# Patient Record
Sex: Female | Born: 1970 | Race: Black or African American | Hispanic: No | Marital: Single | Smoking: Never smoker
Health system: Southern US, Community
[De-identification: ages and names within clinical notes are randomized; demographics above are authoritative.]

## PROBLEM LIST (undated history)

## (undated) DIAGNOSIS — I10 Essential (primary) hypertension: Secondary | ICD-10-CM

## (undated) DIAGNOSIS — R011 Cardiac murmur, unspecified: Secondary | ICD-10-CM

## (undated) DIAGNOSIS — K219 Gastro-esophageal reflux disease without esophagitis: Secondary | ICD-10-CM

## (undated) HISTORY — DX: Cardiac murmur, unspecified: R01.1

## (undated) HISTORY — DX: Gastro-esophageal reflux disease without esophagitis: K21.9

## (undated) HISTORY — DX: Essential (primary) hypertension: I10

---

## 1999-12-30 ENCOUNTER — Other Ambulatory Visit: Admission: RE | Admit: 1999-12-30 | Discharge: 1999-12-30 | Payer: Self-pay | Admitting: Orthopedic Surgery

## 2013-11-27 ENCOUNTER — Ambulatory Visit (HOSPITAL_BASED_OUTPATIENT_CLINIC_OR_DEPARTMENT_OTHER): Payer: BC Managed Care – PPO

## 2013-12-01 ENCOUNTER — Ambulatory Visit (HOSPITAL_BASED_OUTPATIENT_CLINIC_OR_DEPARTMENT_OTHER): Payer: BC Managed Care – PPO | Attending: Internal Medicine

## 2013-12-01 VITALS — Ht 67.0 in | Wt 193.0 lb

## 2013-12-01 DIAGNOSIS — G4733 Obstructive sleep apnea (adult) (pediatric): Secondary | ICD-10-CM

## 2013-12-01 DIAGNOSIS — G4730 Sleep apnea, unspecified: Secondary | ICD-10-CM | POA: Insufficient documentation

## 2013-12-09 DIAGNOSIS — G4733 Obstructive sleep apnea (adult) (pediatric): Secondary | ICD-10-CM

## 2013-12-09 NOTE — Sleep Study (Signed)
   NAME: Haley Rodriguez DATE OF BIRTH:  [birth year] MEDICAL RECORD NUMBER [id number]  LOCATION: [facility]  PHYSICIAN: YOUNG,CLINTON D  DATE OF STUDY: 12/01/2013  SLEEP STUDY TYPE: Nocturnal Polysomnogram               REFERRING PHYSICIAN: Stanford Scotland, NP  INDICATION FOR STUDY: Hypersomnia with sleep apnea  EPWORTH SLEEPINESS SCORE:   6/24 HEIGHT: 5\' 7"  (170.2 cm)  WEIGHT: 193 lb (87.544 kg)    Body mass index is 30.22 kg/(m^2).  NECK SIZE: 13.5 in.  MEDICATIONS: Chart for review  SLEEP ARCHITECTURE: Total sleep time 338.5 minutes with sleep efficiency 90.6%. Stage I was 1.5%, stage II 78.1%, stage III absent, REM 20.4% of total sleep time. Sleep latency 20 minutes, REM latency 52 minutes, awake after sleep onset 15 minutes, arousal index 3.4, bedtime medication: None  RESPIRATORY DATA: Apnea hypopneas index (AHI) 4.1 per hour. 23 events scored including 16 obstructive apneas, 7 hypopneas. Events were not positional. REM AHI 17.4 per hour. There were not enough events to permit application of split CPAP titration.  OXYGEN DATA: Mild snoring with oxygen desaturation to a nadir of 87% and mean saturation 97% on room air and  CARDIAC DATA: Sinus rhythm with PACs  MOVEMENT/PARASOMNIA: No significant movement disturbance, no bathroom trips  IMPRESSION/ RECOMMENDATION:   1) Within normal limits. Occasional respiratory events with sleep disturbance, AHI 4.1 per hour. The normal range for adults is an AHI from 0-5 events per hour). REM AHI 17.4 per hour. Events were not positional although patient prefers not to sleep supine. Mild snoring with oxygen desaturation to a nadir of 87% and mean saturation 97% on room air . There were not enough events to permit split CPAP titration.  Deneise Lever Diplomate, American Board of Sleep Medicine  ELECTRONICALLY SIGNED ON:  12/09/2013, 8:50 AM [place] PH: [phone]   FX: [phone] [place]

## 2017-03-05 DIAGNOSIS — M2042 Other hammer toe(s) (acquired), left foot: Secondary | ICD-10-CM | POA: Diagnosis not present

## 2017-03-05 DIAGNOSIS — M71572 Other bursitis, not elsewhere classified, left ankle and foot: Secondary | ICD-10-CM | POA: Diagnosis not present

## 2017-03-05 DIAGNOSIS — M7751 Other enthesopathy of right foot: Secondary | ICD-10-CM | POA: Diagnosis not present

## 2017-03-05 DIAGNOSIS — M7752 Other enthesopathy of left foot: Secondary | ICD-10-CM | POA: Diagnosis not present

## 2017-03-05 DIAGNOSIS — M2580 Other specified joint disorders, unspecified joint: Secondary | ICD-10-CM | POA: Diagnosis not present

## 2017-03-05 DIAGNOSIS — M71571 Other bursitis, not elsewhere classified, right ankle and foot: Secondary | ICD-10-CM | POA: Diagnosis not present

## 2017-03-12 DIAGNOSIS — M7751 Other enthesopathy of right foot: Secondary | ICD-10-CM | POA: Diagnosis not present

## 2017-03-12 DIAGNOSIS — M7752 Other enthesopathy of left foot: Secondary | ICD-10-CM | POA: Diagnosis not present

## 2017-03-12 DIAGNOSIS — M2580 Other specified joint disorders, unspecified joint: Secondary | ICD-10-CM | POA: Diagnosis not present

## 2017-03-26 DIAGNOSIS — Z01419 Encounter for gynecological examination (general) (routine) without abnormal findings: Secondary | ICD-10-CM | POA: Diagnosis not present

## 2017-03-26 DIAGNOSIS — Z3009 Encounter for other general counseling and advice on contraception: Secondary | ICD-10-CM | POA: Diagnosis not present

## 2017-03-26 DIAGNOSIS — Z113 Encounter for screening for infections with a predominantly sexual mode of transmission: Secondary | ICD-10-CM | POA: Diagnosis not present

## 2017-04-12 DIAGNOSIS — Z1151 Encounter for screening for human papillomavirus (HPV): Secondary | ICD-10-CM | POA: Diagnosis not present

## 2017-04-12 DIAGNOSIS — Z01419 Encounter for gynecological examination (general) (routine) without abnormal findings: Secondary | ICD-10-CM | POA: Diagnosis not present

## 2017-05-12 ENCOUNTER — Ambulatory Visit: Payer: Self-pay | Admitting: Family Medicine

## 2017-05-12 DIAGNOSIS — Z0289 Encounter for other administrative examinations: Secondary | ICD-10-CM

## 2018-10-26 ENCOUNTER — Ambulatory Visit: Payer: BLUE CROSS/BLUE SHIELD | Admitting: Family Medicine

## 2018-10-26 DIAGNOSIS — N951 Menopausal and female climacteric states: Secondary | ICD-10-CM | POA: Diagnosis not present

## 2018-10-26 DIAGNOSIS — Z3046 Encounter for surveillance of implantable subdermal contraceptive: Secondary | ICD-10-CM | POA: Diagnosis not present

## 2018-10-26 DIAGNOSIS — Z975 Presence of (intrauterine) contraceptive device: Secondary | ICD-10-CM | POA: Diagnosis not present

## 2018-11-17 ENCOUNTER — Ambulatory Visit: Payer: BC Managed Care – PPO | Admitting: Family Medicine

## 2018-11-17 ENCOUNTER — Encounter: Payer: Self-pay | Admitting: Family Medicine

## 2018-11-17 ENCOUNTER — Other Ambulatory Visit: Payer: Self-pay

## 2018-11-17 VITALS — BP 148/80 | HR 74 | Temp 98.4°F | Ht 67.0 in | Wt 208.0 lb

## 2018-11-17 DIAGNOSIS — K219 Gastro-esophageal reflux disease without esophagitis: Secondary | ICD-10-CM

## 2018-11-17 DIAGNOSIS — I10 Essential (primary) hypertension: Secondary | ICD-10-CM | POA: Diagnosis not present

## 2018-11-17 DIAGNOSIS — R635 Abnormal weight gain: Secondary | ICD-10-CM

## 2018-11-17 DIAGNOSIS — L918 Other hypertrophic disorders of the skin: Secondary | ICD-10-CM | POA: Diagnosis not present

## 2018-11-17 DIAGNOSIS — R600 Localized edema: Secondary | ICD-10-CM

## 2018-11-17 DIAGNOSIS — Z7689 Persons encountering health services in other specified circumstances: Secondary | ICD-10-CM

## 2018-11-17 DIAGNOSIS — Z1211 Encounter for screening for malignant neoplasm of colon: Secondary | ICD-10-CM

## 2018-11-17 MED ORDER — CHLORTHALIDONE 25 MG PO TABS: 25.0000 mg | ORAL_TABLET | Freq: Every day | ORAL | 3 refills | Status: DC

## 2018-11-17 NOTE — Patient Instructions (Signed)
Food Choices for Gastroesophageal Reflux Disease, Adult When you have gastroesophageal reflux disease (GERD), the foods you eat and your eating habits are very important. Choosing the right foods can help ease your discomfort. Think about working with a nutrition specialist (dietitian) to help you make good choices. What are tips for following this plan?  Meals  Choose healthy foods that are low in fat, such as fruits, vegetables, whole grains, low-fat dairy products, and lean meat, fish, and poultry.  Eat small meals often instead of 3 large meals a day. Eat your meals slowly, and in a place where you are relaxed. Avoid bending over or lying down until 2-3 hours after eating.  Avoid eating meals 2-3 hours before bed.  Avoid drinking a lot of liquid with meals.  Cook foods using methods other than frying. Bake, grill, or broil food instead.  Avoid or limit: ? Chocolate. ? Peppermint or spearmint. ? Alcohol. ? Pepper. ? Black and decaffeinated coffee. ? Black and decaffeinated tea. ? Bubbly (carbonated) soft drinks. ? Caffeinated energy drinks and soft drinks.  Limit high-fat foods such as: ? Fatty meat or fried foods. ? Whole milk, cream, butter, or ice cream. ? Nuts and nut butters. ? Pastries, donuts, and sweets made with butter or shortening.  Avoid foods that cause symptoms. These foods may be different for everyone. Common foods that cause symptoms include: ? Tomatoes. ? Oranges, lemons, and limes. ? Peppers. ? Spicy food. ? Onions and garlic. ? Vinegar. Lifestyle  Maintain a healthy weight. Ask your doctor what weight is healthy for you. If you need to lose weight, work with your doctor to do so safely.  Exercise for at least 30 minutes for 5 or more days each week, or as told by your doctor.  Wear loose-fitting clothes.  Do not smoke. If you need help quitting, ask your doctor.  Sleep with the head of your bed higher than your feet. Use a wedge under the  mattress or blocks under the bed frame to raise the head of the bed. Summary  When you have gastroesophageal reflux disease (GERD), food and lifestyle choices are very important in easing your symptoms.  Eat small meals often instead of 3 large meals a day. Eat your meals slowly, and in a place where you are relaxed.  Limit high-fat foods such as fatty meat or fried foods.  Avoid bending over or lying down until 2-3 hours after eating.  Avoid peppermint and spearmint, caffeine, alcohol, and chocolate. This information is not intended to replace advice given to you by your health care provider. Make sure you discuss any questions you have with your health care provider. Document Released: 08/11/2011 Document Revised: 06/02/2018 Document Reviewed: 03/17/2016 Elsevier Patient Education  2020 [employer].  Managing Your Hypertension Hypertension is commonly called high blood pressure. This is when the force of your blood pressing against the walls of your arteries is too strong. Arteries are blood vessels that carry blood from your heart throughout your body. Hypertension forces the heart to work harder to pump blood, and may cause the arteries to become narrow or stiff. Having untreated or uncontrolled hypertension can cause heart attack, stroke, kidney disease, and other problems. What are blood pressure readings? A blood pressure reading consists of a higher number over a lower number. Ideally, your blood pressure should be below 120/80. The first ("top") number is called the systolic pressure. It is a measure of the pressure in your arteries as your heart beats. The  second ("bottom") number is called the diastolic pressure. It is a measure of the pressure in your arteries as the heart relaxes. What does my blood pressure reading mean? Blood pressure is classified into four stages. Based on your blood pressure reading, your health care provider may use the following stages to determine what  type of treatment you need, if any. Systolic pressure and diastolic pressure are measured in a unit called mm Hg. Normal  Systolic pressure: below 761.  Diastolic pressure: below 80. Elevated  Systolic pressure: 950-932.  Diastolic pressure: below 80. Hypertension stage 1  Systolic pressure: 671-245.  Diastolic pressure: 80-99. Hypertension stage 2  Systolic pressure: 833 or above.  Diastolic pressure: 90 or above. What health risks are associated with hypertension? Managing your hypertension is an important responsibility. Uncontrolled hypertension can lead to:  A heart attack.  A stroke.  A weakened blood vessel (aneurysm).  Heart failure.  Kidney damage.  Eye damage.  Metabolic syndrome.  Memory and concentration problems. What changes can I make to manage my hypertension? Hypertension can be managed by making lifestyle changes and possibly by taking medicines. Your health care provider will help you make a plan to bring your blood pressure within a normal range. Eating and drinking   Eat a diet that is high in fiber and potassium, and low in salt (sodium), added sugar, and fat. An example eating plan is called the DASH (Dietary Approaches to Stop Hypertension) diet. To eat this way: ? Eat plenty of fresh fruits and vegetables. Try to fill half of your plate at each meal with fruits and vegetables. ? Eat whole grains, such as whole wheat pasta, brown rice, or whole grain bread. Fill about one quarter of your plate with whole grains. ? Eat low-fat diary products. ? Avoid fatty cuts of meat, processed or cured meats, and poultry with skin. Fill about one quarter of your plate with lean proteins such as fish, chicken without skin, beans, eggs, and tofu. ? Avoid premade and processed foods. These tend to be higher in sodium, added sugar, and fat.  Reduce your daily sodium intake. Most people with hypertension should eat less than 1,500 mg of sodium a day.  Limit  alcohol intake to no more than 1 drink a day for nonpregnant women and 2 drinks a day for men. One drink equals 12 oz of beer, 5 oz of wine, or 1 oz of hard liquor. Lifestyle  Work with your health care provider to maintain a healthy body weight, or to lose weight. Ask what an ideal weight is for you.  Get at least 30 minutes of exercise that causes your heart to beat faster (aerobic exercise) most days of the week. Activities may include walking, swimming, or biking.  Include exercise to strengthen your muscles (resistance exercise), such as weight lifting, as part of your weekly exercise routine. Try to do these types of exercises for 30 minutes at least 3 days a week.  Do not use any products that contain nicotine or tobacco, such as cigarettes and e-cigarettes. If you need help quitting, ask your health care provider.  Control any long-term (chronic) conditions you have, such as high cholesterol or diabetes. Monitoring  Monitor your blood pressure at home as told by your health care provider. Your personal target blood pressure may vary depending on your medical conditions, your age, and other factors.  Have your blood pressure checked regularly, as often as told by your health care provider. Working with your health  care provider  Review all the medicines you take with your health care provider because there may be side effects or interactions.  Talk with your health care provider about your diet, exercise habits, and other lifestyle factors that may be contributing to hypertension.  Visit your health care provider regularly. Your health care provider can help you create and adjust your plan for managing hypertension. Will I need medicine to control my blood pressure? Your health care provider may prescribe medicine if lifestyle changes are not enough to get your blood pressure under control, and if:  Your systolic blood pressure is 130 or higher.  Your diastolic blood pressure is  80 or higher. Take medicines only as told by your health care provider. Follow the directions carefully. Blood pressure medicines must be taken as prescribed. The medicine does not work as well when you skip doses. Skipping doses also puts you at risk for problems. Contact a health care provider if:  You think you are having a reaction to medicines you have taken.  You have repeated (recurrent) headaches.  You feel dizzy.  You have swelling in your ankles.  You have trouble with your vision. Get help right away if:  You develop a severe headache or confusion.  You have unusual weakness or numbness, or you feel faint.  You have severe pain in your chest or abdomen.  You vomit repeatedly.  You have trouble breathing. Summary  Hypertension is when the force of blood pumping through your arteries is too strong. If this condition is not controlled, it may put you at risk for serious complications.  Your personal target blood pressure may vary depending on your medical conditions, your age, and other factors. For most people, a normal blood pressure is less than 120/80.  Hypertension is managed by lifestyle changes, medicines, or both. Lifestyle changes include weight loss, eating a healthy, low-sodium diet, exercising more, and limiting alcohol. This information is not intended to replace advice given to you by your health care provider. Make sure you discuss any questions you have with your health care provider. Document Released: 11/04/2011 Document Revised: 06/03/2018 Document Reviewed: 01/08/2016 Elsevier Patient Education  2020 [place], Adult  A skin tag (acrochordon) is a soft, extra growth of skin. Most skin tags are flesh-colored and rarely bigger than a pencil eraser. They commonly form near areas where there are folds in the skin, such as the armpit or groin. Skin tags are not dangerous, and they do not spread from person to person (are not contagious). You  may have one skin tag or several. Skin tags do not require treatment. However, your health care provider may recommend removal of a skin tag if it:  Gets irritated from clothing.  Bleeds.  Is visible and unsightly. Your health care provider can remove skin tags with a simple surgical procedure or a procedure that involves freezing the skin tag. Follow these instructions at home:  Watch for any changes in your skin tag. A normal skin tag does not require any other special care at home.  Take over-the-counter and prescription medicines only as told by your health care provider.  Keep all follow-up visits as told by your health care provider. This is important. Contact a health care provider if:  You have a skin tag that: ? Becomes painful. ? Changes color. ? Bleeds. ? Swells.  You develop more skin tags. This information is not intended to replace advice given to you by your health care  provider. Make sure you discuss any questions you have with your health care provider. Document Released: 02/24/2015 Document Revised: 01/22/2017 Document Reviewed: 02/24/2015 Elsevier Patient Education  2020 [address].  Edema  Edema is an abnormal buildup of fluids in the body tissues and under the skin. Swelling of the legs, feet, and ankles is a common symptom that becomes more likely as you get older. Swelling is also common in looser tissues, like around the eyes. When the affected area is squeezed, the fluid may move out of that spot and leave a dent for a few moments. This dent is called pitting edema. There are many possible causes of edema. Eating too much salt (sodium) and being on your feet or sitting for a long time can cause edema in your legs, feet, and ankles. Hot weather may make edema worse. Common causes of edema include:  Heart failure.  Liver or kidney disease.  Weak leg blood vessels.  Cancer.  An injury.  Pregnancy.  Medicines.  Being obese.  Low protein levels  in the blood. Edema is usually painless. Your skin may look swollen or shiny. Follow these instructions at home:  Keep the affected body part raised (elevated) above the level of your heart when you are sitting or lying down.  Do not sit still or stand for long periods of time.  Do not wear tight clothing. Do not wear garters on your upper legs.  Exercise your legs to get your circulation going. This helps to move the fluid back into your blood vessels, and it may help the swelling go down.  Wear elastic bandages or support stockings to reduce swelling as told by your health care provider.  Eat a low-salt (low-sodium) diet to reduce fluid as told by your health care provider.  Depending on the cause of your swelling, you may need to limit how much fluid you drink (fluid restriction).  Take over-the-counter and prescription medicines only as told by your health care provider. Contact a health care provider if:  Your edema does not get better with treatment.  You have heart, liver, or kidney disease and have symptoms of edema.  You have sudden and unexplained weight gain. Get help right away if:  You develop shortness of breath or chest pain.  You cannot breathe when you lie down.  You develop pain, redness, or warmth in the swollen areas.  You have heart, liver, or kidney disease and suddenly get edema.  You have a fever and your symptoms suddenly get worse. Summary  Edema is an abnormal buildup of fluids in the body tissues and under the skin.  Eating too much salt (sodium) and being on your feet or sitting for a long time can cause edema in your legs, feet, and ankles.  Keep the affected body part raised (elevated) above the level of your heart when you are sitting or lying down. This information is not intended to replace advice given to you by your health care provider. Make sure you discuss any questions you have with your health care provider. Document Released:  02/09/2005 Document Revised: 02/12/2017 Document Reviewed: 03/14/2016 Elsevier Patient Education  2020 [employer].

## 2018-11-18 DIAGNOSIS — Z3046 Encounter for surveillance of implantable subdermal contraceptive: Secondary | ICD-10-CM | POA: Diagnosis not present

## 2018-11-18 LAB — BASIC METABOLIC PANEL
BUN: 8 mg/dL (ref 6–23)
CO2: 27 mEq/L (ref 19–32)
Calcium: 9.6 mg/dL (ref 8.4–10.5)
Chloride: 103 mEq/L (ref 96–112)
Creatinine, Ser: 0.64 mg/dL (ref 0.40–1.20)
GFR: 118.75 mL/min (ref >60.00)
Glucose, Bld: 137 mg/dL — ABNORMAL HIGH (ref 70–99)
Potassium: 4.1 mEq/L (ref 3.5–5.1)
Sodium: 140 mEq/L (ref 135–145)

## 2018-11-18 LAB — BRAIN NATRIURETIC PEPTIDE: Pro B Natriuretic peptide (BNP): 18 pg/mL (ref 0.0–100.0)

## 2018-11-18 LAB — TSH: TSH: 0.73 u[IU]/mL (ref 0.35–4.50)

## 2018-11-21 ENCOUNTER — Encounter: Payer: Self-pay | Admitting: Family Medicine

## 2018-11-21 NOTE — Progress Notes (Signed)
Patient presents to clinic today to establish care.  SUBJECTIVE: PMH: Pt is a 48 yo female with pmh sig for HTN, GERD, h/o heart murmur.  Previously seen by a NP at her job.  GERD:  -taking pantoprazole -notes symptoms with sphaghetti and sour foods  HTN: -not currently on meds -Triamteren-HCTZ 37.5-25 mg caused heart flutters -Spironolactone 50 mg was "not strong enough" -Norvasc 5 mg caused LE edema  LE edema: -notes worse at the end of the day -does a lot of standing at work. -endorses eating out, mostly fast food.  As does not like lerftovers and does not want to cook for one person.  Bump on leg: -pt notes large bump on L inner thigh -gets irritated at times -pt interested in having it removed  Weight gain: -pt notes increased wt -not exercising -eating out mostly  Allergies: NKDA  Social hx: Pt is single.  She works as an Mining engineer.  Pt has one child.  Pt denies tobacco and drug use.  Pt endorses social EtOH use.     Health Maintenance: Dental --Dr. Trudi Ida and Dr. Purvis Kilts Colonoscopy -- never had Mammogram -- never had PAP -- 2017  Family Medical Hx: Mom- desc, lung cancer, miscarriage Dad-alive, HTN  Past Medical History:  Diagnosis Date  . GERD (gastroesophageal reflux disease)   . Heart murmur   . Hypertension     History reviewed. No pertinent surgical history.  Current Outpatient Medications on File Prior to Visit  Medication Sig Dispense Refill  . pantoprazole (PROTONIX) 40 MG tablet Take 40 mg by mouth daily.     No current facility-administered medications on file prior to visit.     Not on File  History reviewed. No pertinent family history.  Social History   Socioeconomic History  . Marital status: Legally Separated    Spouse name: Not on file  . Number of children: Not on file  . Years of education: Not on file  . Highest education level: Not on file  Occupational History  . Not on file  Social Needs  . Financial  resource strain: Not on file  . Food insecurity    Worry: Not on file    Inability: Not on file  . Transportation needs    Medical: Not on file    Non-medical: Not on file  Tobacco Use  . Smoking status: Never Smoker  . Smokeless tobacco: Never Used  Substance and Sexual Activity  . Alcohol use: Yes  . Drug use: Never  . Sexual activity: Yes  Lifestyle  . Physical activity    Days per week: Not on file    Minutes per session: Not on file  . Stress: Not on file  Relationships  . Social Herbalist on phone: Not on file    Gets together: Not on file    Attends religious service: Not on file    Active member of club or organization: Not on file    Attends meetings of clubs or organizations: Not on file    Relationship status: Not on file  . Intimate partner violence    Fear of current or ex partner: Not on file    Emotionally abused: Not on file    Physically abused: Not on file    Forced sexual activity: Not on file  Other Topics Concern  . Not on file  Social History Narrative  . Not on file    ROS General: Denies fever, chills, night sweats,  changes in weight, changes in appetite HEENT: Denies headaches, ear pain, changes in vision, rhinorrhea, sore throat CV: Denies CP, palpitations, SOB, orthopnea Pulm: Denies SOB, cough, wheezing GI: Denies abdominal pain, nausea, vomiting, diarrhea, constipation  +GERD GU: Denies dysuria, hematuria, frequency, vaginal discharge Msk: Denies muscle cramps, joint pains Neuro: Denies weakness, numbness, tingling Skin: Denies rashes, bruising Psych: Denies depression, anxiety, hallucinations   BP (!) 148/80 (BP Location: Right Arm, Patient Position: Sitting, Cuff Size: Large)   Pulse 74   Temp 98.4 F (36.9 C) (Oral)   Ht 5\' 7"  (1.702 m)   Wt 208 lb (94.3 kg)   SpO2 98%   BMI 32.58 kg/m   Physical Exam Gen. Pleasant, well developed, well-nourished, in NAD HEENT - [place]/AT, PERRL, no scleral icterus, no nasal  drainage, pharynx without erythema or exudate. Lungs: no use of accessory muscles, CTAB, no wheezes, rales or rhonchi Cardiovascular: RRR, No r/g/m, no peripheral edema Abdomen: BS present, soft, nontender,nondistended Musculoskeletal: No deformities, moves all four extremities, no cyanosis or clubbing, normal tone Neuro:  A&Ox3, CN II-XII intact, normal gait Skin:  Warm, dry, intact.  Large skin tag on L medial thigh with large stalk, mobile, without erythema or drainage.  Recent Results (from the past 2160 hour(s))  Basic metabolic panel     Status: Abnormal   Collection Time: 11/17/18  3:56 PM  Result Value Ref Range   Sodium 140 135 - 145 mEq/L   Potassium 4.1 3.5 - 5.1 mEq/L   Chloride 103 96 - 112 mEq/L   CO2 27 19 - 32 mEq/L   Glucose, Bld 137 (H) 70 - 99 mg/dL   BUN 8 6 - 23 mg/dL   Creatinine, Ser 0.64 0.40 - 1.20 mg/dL   Calcium 9.6 8.4 - 10.5 mg/dL   GFR 118.75 >60.00 mL/min  TSH     Status: None   Collection Time: 11/17/18  3:56 PM  Result Value Ref Range   TSH 0.73 0.35 - 4.50 uIU/mL  Brain Natriuretic Peptide     Status: None   Collection Time: 11/17/18  3:56 PM  Result Value Ref Range   Pro B Natriuretic peptide (BNP) 18.0 0.0 - 100.0 pg/mL    Assessment/Plan: Essential hypertension  -elevated -discussed starting a new bp med. -pt encouraged to increase po intake of water and decrease sodium intake - Plan: Basic metabolic panel, chlorthalidone (HYGROTON) 25 MG tablet  Gastroesophageal reflux disease, esophagitis presence not specified -discussed foods to avoid -continue pantoprazole -given handouts  Skin tag -discussed options for removal -pt to schedule appt for removal  Encounter to establish care -We reviewed the PMH, PSH, FH, SH, Meds and Allergies. -We provided refills for any medications we will prescribe as needed. -We addressed current concerns per orders and patient instructions. -We have asked for records for pertinent exams, studies,  vaccines and notes from previous providers. -We have advised patient to follow up per instructions below.  Weight gain  -discussed increasing physical activity, decrease intake of takeout - Plan: TSH  Leg edema  -discussed decreasing sodium intake -elevated LEs when able -given handout - Plan: Basic metabolic panel, Brain Natriuretic Peptide  F/u prn  Grier Mitts, MD

## 2018-11-22 ENCOUNTER — Encounter: Payer: Self-pay | Admitting: Gastroenterology

## 2018-12-08 ENCOUNTER — Ambulatory Visit: Payer: BC Managed Care – PPO | Admitting: Family Medicine

## 2018-12-08 ENCOUNTER — Other Ambulatory Visit: Payer: Self-pay

## 2018-12-08 ENCOUNTER — Encounter: Payer: Self-pay | Admitting: Family Medicine

## 2018-12-08 VITALS — BP 140/80 | HR 78 | Temp 97.0°F | Wt 204.0 lb

## 2018-12-08 DIAGNOSIS — L918 Other hypertrophic disorders of the skin: Secondary | ICD-10-CM | POA: Diagnosis not present

## 2018-12-08 NOTE — Progress Notes (Signed)
Subjective:    Patient ID: Haley Rodriguez, female    DOB: April 15, 1970, 48 y.o.   MRN: [id number]  No chief complaint on file.   HPI Patient was seen today for removal of skin tag.  On L medial thigh.  Causes irritation.  Past Medical History:  Diagnosis Date  . GERD (gastroesophageal reflux disease)   . Heart murmur   . Hypertension     Not on File  ROS General: Denies fever, chills, night sweats, changes in weight, changes in appetite HEENT: Denies headaches, ear pain, changes in vision, rhinorrhea, sore throat CV: Denies CP, palpitations, SOB, orthopnea Pulm: Denies SOB, cough, wheezing GI: Denies abdominal pain, nausea, vomiting, diarrhea, constipation GU: Denies dysuria, hematuria, frequency, vaginal discharge Msk: Denies muscle cramps, joint pains Neuro: Denies weakness, numbness, tingling Skin: Denies rashes, bruising  +skin tag Psych: Denies depression, anxiety, hallucinations    Objective:    Blood pressure 140/80, pulse 78, temperature (!) 97 F (36.1 C), temperature source Temporal, weight 204 lb (92.5 kg), SpO2 98 %.  Gen. Pleasant, well-nourished, in no distress, normal affect   HEENT: [place]/AT, face symmetric, no scleral icterus, PERRLA, nares patent without drainage Lungs: no accessory muscle use, Cardiovascular: RRR, no peripheral edema Neuro:  A&Ox3, CN II-XII intact, normal gait Skin:  Warm, dry intact. 1.5 cm skin tag with stalk base on L medial thigh without erythema or irritation.   Wt Readings from Last 3 Encounters:  11/17/18 208 lb (94.3 kg)  12/01/13 193 lb (87.5 kg)    Lab Results  Component Value Date   GLUCOSE 137 (H) 11/17/2018   NA 140 11/17/2018   K 4.1 11/17/2018   CL 103 11/17/2018   CREATININE 0.64 11/17/2018   BUN 8 11/17/2018   CO2 27 11/17/2018   TSH 0.73 11/17/2018   Skin Tag Removal Procedure Note  Pre-operative Diagnosis: skin tags (acrochordon) with wide stalk  Post-operative Diagnosis: Classic skin tags  (acrochordon)  Locations:left, medial, upper thigh   Indications: as above  Anesthesia: Lidocaine 1% without epinephrine without added sodium bicarbonate  Procedure Details  The risks (including bleeding and infection) and benefits of the procedure and Verbal informed consent obtained. Using sterile iris scissors, multiple skin tags were snipped off at their bases after cleansing with Betadine.  Bleeding was controlled by pressure.   Findings: Pathognomonic benign lesions  not sent for pathological exam.  Condition: Stable  Complications: none.  Plan: 1. Instructed to keep the wounds dry and covered for 24-48h and clean thereafter. 2. Warning signs of infection were reviewed.   3. Recommended that the patient use NSAID as needed for pain.  4. Return as needed.  Assessment/Plan:  Skin tag  -Consent obtained.  Skin tag removed from left upper medial thigh with cauterization.  1% lidocaine without Epi used.  Pt tolerated procedure well.  Bleeding minimal. -We will send for pathology -Given precautions - Plan: Pathology ([facility])  F/u prn  Grier Mitts, MD

## 2018-12-08 NOTE — Patient Instructions (Addendum)
Today you had your skin tag removed.  You should continue to monitor for ongoing swelling, increased warmth to the area of skin, or pus draining from the area, or fever as these can be signs of infection.  If you have increased pain or continued bleeding please notify the clinic.  You should leave the pressure bandage in place for 24 hours.  It is okay to take ibuprofen for any soreness. Skin Tag, Adult  A skin tag (acrochordon) is a soft, extra growth of skin. Most skin tags are flesh-colored and rarely bigger than a pencil eraser. They commonly form near areas where there are folds in the skin, such as the armpit or groin. Skin tags are not dangerous, and they do not spread from person to person (are not contagious). You may have one skin tag or several. Skin tags do not require treatment. However, your health care provider may recommend removal of a skin tag if it:  Gets irritated from clothing.  Bleeds.  Is visible and unsightly. Your health care provider can remove skin tags with a simple surgical procedure or a procedure that involves freezing the skin tag. Follow these instructions at home:  Watch for any changes in your skin tag. A normal skin tag does not require any other special care at home.  Take over-the-counter and prescription medicines only as told by your health care provider.  Keep all follow-up visits as told by your health care provider. This is important. Contact a health care provider if:  You have a skin tag that: ? Becomes painful. ? Changes color. ? Bleeds. ? Swells.  You develop more skin tags. This information is not intended to replace advice given to you by your health care provider. Make sure you discuss any questions you have with your health care provider. Document Released: 02/24/2015 Document Revised: 01/22/2017 Document Reviewed: 02/24/2015 Elsevier Patient Education  2020 [place].    Skin Biopsy, Care After This sheet gives you  information about how to care for yourself after your procedure. Your health care provider may also give you more specific instructions. If you have problems or questions, contact your health care provider. What can I expect after the procedure? After the procedure, it is common to have:  Soreness.  Bruising.  Itching. Follow these instructions at home: Biopsy site care Follow instructions from your health care provider about how to take care of your biopsy site. Make sure you:  Wash your hands with soap and water before and after you change your bandage (dressing). If soap and water are not available, use hand sanitizer.  Apply ointment on your biopsy site as directed by your health care provider.  Change your dressing as told by your health care provider.  Leave stitches (sutures), skin glue, or adhesive strips in place. These skin closures may need to stay in place for 2 weeks or longer. If adhesive strip edges start to loosen and curl up, you may trim the loose edges. Do not remove adhesive strips completely unless your health care provider tells you to do that.  If the biopsy area bleeds, apply gentle pressure for 10 minutes. Check your biopsy site every day for signs of infection. Check for:  Redness, swelling, or pain.  Fluid or blood.  Warmth.  Pus or a bad smell.  General instructions  Rest and then return to your normal activities as told by your health care provider.  Take over-the-counter and prescription medicines only as told by your health care  provider.  Keep all follow-up visits as told by your health care provider. This is important. Contact a health care provider if:  You have redness, swelling, or pain around your biopsy site.  You have fluid or blood coming from your biopsy site.  Your biopsy site feels warm to the touch.  You have pus or a bad smell coming from your biopsy site.  You have a fever.  Your sutures, skin glue, or adhesive strips  loosen or come off sooner than expected. Get help right away if:  You have bleeding that does not stop with pressure or a dressing. Summary  After the procedure, it is common to have soreness, bruising, and itching at the site.  Follow instructions from your health care provider about how to take care of your biopsy site.  Check your biopsy site every day for signs of infection.  Contact a health care provider if you have redness, swelling, or pain around your biopsy site, or your biopsy site feels warm to the touch.  Keep all follow-up visits as told by your health care provider. This is important. This information is not intended to replace advice given to you by your health care provider. Make sure you discuss any questions you have with your health care provider. Document Released: 03/08/2015 Document Revised: 08/09/2017 Document Reviewed: 08/09/2017 Elsevier Patient Education  2020 [employer].

## 2018-12-12 ENCOUNTER — Encounter: Payer: Self-pay | Admitting: Family Medicine

## 2018-12-16 ENCOUNTER — Telehealth: Payer: Self-pay | Admitting: *Deleted

## 2018-12-16 NOTE — Telephone Encounter (Signed)
Spoke with pt. She stated "I honestly forgot,i will call you back".

## 2018-12-16 NOTE — Telephone Encounter (Signed)
Called pt. Back to see if she wanted to reschedule nurse pre-visit,left message with # to call back by end of day or procedure will be cancelled.

## 2018-12-16 NOTE — Telephone Encounter (Signed)
Pt returned call stating that she prefers to wait until after Covid-19 to have procedure. Procedure was cancelled.

## 2018-12-19 LAB — ANATOMIC PATHOLOGY REPORT: PDF Image: 0

## 2018-12-30 ENCOUNTER — Encounter: Payer: BC Managed Care – PPO | Admitting: Gastroenterology

## 2019-02-06 DIAGNOSIS — Z20828 Contact with and (suspected) exposure to other viral communicable diseases: Secondary | ICD-10-CM | POA: Diagnosis not present

## 2019-03-21 ENCOUNTER — Other Ambulatory Visit: Payer: Self-pay | Admitting: Family Medicine

## 2019-03-21 DIAGNOSIS — I10 Essential (primary) hypertension: Secondary | ICD-10-CM

## 2019-12-06 ENCOUNTER — Encounter: Payer: BC Managed Care – PPO | Admitting: Family Medicine

## 2020-03-27 DIAGNOSIS — Z20822 Contact with and (suspected) exposure to covid-19: Secondary | ICD-10-CM | POA: Diagnosis not present

## 2020-06-10 ENCOUNTER — Ambulatory Visit (INDEPENDENT_AMBULATORY_CARE_PROVIDER_SITE_OTHER): Payer: BC Managed Care – PPO

## 2020-06-10 ENCOUNTER — Encounter: Payer: Self-pay | Admitting: Podiatry

## 2020-06-10 ENCOUNTER — Other Ambulatory Visit: Payer: Self-pay

## 2020-06-10 ENCOUNTER — Ambulatory Visit: Payer: BC Managed Care – PPO | Admitting: Podiatry

## 2020-06-10 DIAGNOSIS — M21612 Bunion of left foot: Secondary | ICD-10-CM

## 2020-06-10 DIAGNOSIS — M2041 Other hammer toe(s) (acquired), right foot: Secondary | ICD-10-CM | POA: Diagnosis not present

## 2020-06-10 DIAGNOSIS — M21611 Bunion of right foot: Secondary | ICD-10-CM | POA: Diagnosis not present

## 2020-06-10 DIAGNOSIS — M2042 Other hammer toe(s) (acquired), left foot: Secondary | ICD-10-CM | POA: Diagnosis not present

## 2020-06-10 DIAGNOSIS — L84 Corns and callosities: Secondary | ICD-10-CM

## 2020-06-10 DIAGNOSIS — M21619 Bunion of unspecified foot: Secondary | ICD-10-CM

## 2020-06-10 NOTE — Progress Notes (Signed)
Subjective:   Patient ID: Haley Rodriguez, female   DOB: 50 y.o.   MRN: [id number]   HPI Patient presents with a lot of pain in the fifth digits of both feet stating its been there for years and someone else wanted to do surgery she was trying to avoid that if possible but they are getting worse over time especially with shoe gear.  She wears tennis shoes at work and she likes to be active.  Patient does not smoke   Review of Systems  All other systems reviewed and are negative.       Objective:  Physical Exam Vitals and nursing note reviewed.  Constitutional:      Appearance: She is well-developed.  Pulmonary:     Effort: Pulmonary effort is normal.  Musculoskeletal:        General: Normal Rodriguez of motion.  Skin:    General: Skin is warm.  Neurological:     Mental Status: She is alert.     Neurovascular status intact muscle strength adequate Rodriguez of motion adequate patient noted to have keratotic lesion with rotational component fifth digit bilateral with keratotic tissue formation that is painful when pressed.  Patient is found to have good digit perfusion well oriented x3     Assessment:  Chronic hammertoe deformity fifth digit bilateral with keratotic lesion formation bilateral     Plan:  H&P x-rays reviewed discussed condition explained rotational component to these toes considerations for arthroplasty and educated her on surgical intervention and the fact she could wear surgical shoes with toe guards.  At this point deep debridement accomplished with padding we will see the results of this and decide whether or not surgical intervention will be necessary and again she is educated on this  X-rays indicate significant rotation digit 5 bilateral

## 2020-08-22 ENCOUNTER — Other Ambulatory Visit: Payer: Self-pay

## 2020-08-23 ENCOUNTER — Telehealth: Payer: Self-pay | Admitting: Family Medicine

## 2020-08-23 ENCOUNTER — Encounter: Payer: Self-pay | Admitting: Family Medicine

## 2020-08-23 ENCOUNTER — Ambulatory Visit (INDEPENDENT_AMBULATORY_CARE_PROVIDER_SITE_OTHER): Payer: BC Managed Care – PPO | Admitting: Family Medicine

## 2020-08-23 VITALS — BP 136/82 | Temp 98.4°F | Ht 67.0 in | Wt 193.4 lb

## 2020-08-23 DIAGNOSIS — Z0000 Encounter for general adult medical examination without abnormal findings: Secondary | ICD-10-CM | POA: Diagnosis not present

## 2020-08-23 DIAGNOSIS — I10 Essential (primary) hypertension: Secondary | ICD-10-CM

## 2020-08-23 DIAGNOSIS — Z1159 Encounter for screening for other viral diseases: Secondary | ICD-10-CM | POA: Diagnosis not present

## 2020-08-23 DIAGNOSIS — Z6830 Body mass index (BMI) 30.0-30.9, adult: Secondary | ICD-10-CM

## 2020-08-23 DIAGNOSIS — Z1211 Encounter for screening for malignant neoplasm of colon: Secondary | ICD-10-CM | POA: Diagnosis not present

## 2020-08-23 DIAGNOSIS — E876 Hypokalemia: Secondary | ICD-10-CM

## 2020-08-23 DIAGNOSIS — E6609 Other obesity due to excess calories: Secondary | ICD-10-CM | POA: Diagnosis not present

## 2020-08-23 DIAGNOSIS — E559 Vitamin D deficiency, unspecified: Secondary | ICD-10-CM

## 2020-08-23 DIAGNOSIS — E785 Hyperlipidemia, unspecified: Secondary | ICD-10-CM

## 2020-08-23 LAB — LIPID PANEL
Cholesterol: 168 mg/dL (ref 0–200)
HDL: 50.2 mg/dL (ref >39.00)
LDL Cholesterol: 106 mg/dL — ABNORMAL HIGH (ref 0–99)
NonHDL: 117.56
Total CHOL/HDL Ratio: 3
Triglycerides: 59 mg/dL (ref 0.0–149.0)
VLDL: 11.8 mg/dL (ref 0.0–40.0)

## 2020-08-23 LAB — COMPREHENSIVE METABOLIC PANEL
ALT: 13 U/L (ref 0–35)
AST: 18 U/L (ref 0–37)
Albumin: 4.5 g/dL (ref 3.5–5.2)
Alkaline Phosphatase: 89 U/L (ref 39–117)
BUN: 14 mg/dL (ref 6–23)
CO2: 30 mEq/L (ref 19–32)
Calcium: 10.4 mg/dL (ref 8.4–10.5)
Chloride: 99 mEq/L (ref 96–112)
Creatinine, Ser: 0.84 mg/dL (ref 0.40–1.20)
GFR: 80.14 mL/min (ref >60.00)
Glucose, Bld: 96 mg/dL (ref 70–99)
Potassium: 3.3 mEq/L — ABNORMAL LOW (ref 3.5–5.1)
Sodium: 141 mEq/L (ref 135–145)
Total Bilirubin: 0.5 mg/dL (ref 0.2–1.2)
Total Protein: 8.3 g/dL (ref 6.0–8.3)

## 2020-08-23 LAB — CBC WITH DIFFERENTIAL/PLATELET
Basophils Absolute: 0 10*3/uL (ref 0.0–0.1)
Basophils Relative: 0.3 % (ref 0.0–3.0)
Eosinophils Absolute: 0 10*3/uL (ref 0.0–0.7)
Eosinophils Relative: 0.4 % (ref 0.0–5.0)
HCT: 43.4 % (ref 36.0–46.0)
Hemoglobin: 14.8 g/dL (ref 12.0–15.0)
Lymphocytes Relative: 30.6 % (ref 12.0–46.0)
Lymphs Abs: 2.6 10*3/uL (ref 0.7–4.0)
MCHC: 34 g/dL (ref 30.0–36.0)
MCV: 89.5 fl (ref 78.0–100.0)
Monocytes Absolute: 0.6 10*3/uL (ref 0.1–1.0)
Monocytes Relative: 7 % (ref 3.0–12.0)
Neutro Abs: 5.2 10*3/uL (ref 1.4–7.7)
Neutrophils Relative %: 61.7 % (ref 43.0–77.0)
Platelets: 231 10*3/uL (ref 150.0–400.0)
RBC: 4.85 Mil/uL (ref 3.87–5.11)
RDW: 13.5 % (ref 11.5–15.5)
WBC: 8.5 10*3/uL (ref 4.0–10.5)

## 2020-08-23 LAB — VITAMIN B12: Vitamin B-12: 678 pg/mL (ref 211–911)

## 2020-08-23 LAB — T4, FREE: Free T4: 0.98 ng/dL (ref 0.60–1.60)

## 2020-08-23 LAB — VITAMIN D 25 HYDROXY (VIT D DEFICIENCY, FRACTURES): VITD: 14.97 ng/mL — ABNORMAL LOW (ref 30.00–100.00)

## 2020-08-23 LAB — HEMOGLOBIN A1C: Hgb A1c MFr Bld: 6.2 % (ref 4.6–6.5)

## 2020-08-23 LAB — TSH: TSH: 0.32 u[IU]/mL — ABNORMAL LOW (ref 0.35–5.50)

## 2020-08-23 MED ORDER — VITAMIN D (ERGOCALCIFEROL) 1.25 MG (50000 UNIT) PO CAPS: 50000.0000 [IU] | ORAL_CAPSULE | ORAL | 0 refills | Status: DC

## 2020-08-23 MED ORDER — POTASSIUM CHLORIDE CRYS ER 20 MEQ PO TBCR: 20.0000 meq | EXTENDED_RELEASE_TABLET | Freq: Every day | ORAL | 3 refills | Status: DC

## 2020-08-23 NOTE — Progress Notes (Signed)
Subjective:     Haley Rodriguez is a 50 y.o. female and is here for a comprehensive physical exam. The patient reports she has been doing well.  Losing weight on Saxenda 0.6 mg x 1 yr.  Started my nurse at work.  Was 220 pounds, currently 193.4 pounds.  Stable on chlorthalidone 25 mg daily for BP, per chart review should be out of med.  Per pt, receiving refills from nurse at her job.  Patient not currently exercising.  Drinking 8 ounces of water per day.  Taking simvastatin 20 mg.  Needs to schedule mammogram and colonoscopy.  Social History   Socioeconomic History   Marital status: Single    Spouse name: Not on file   Number of children: Not on file   Years of education: Not on file   Highest education level: Not on file  Occupational History   Not on file  Tobacco Use   Smoking status: Never   Smokeless tobacco: Never  Substance and Sexual Activity   Alcohol use: Yes   Drug use: Never   Sexual activity: Yes  Other Topics Concern   Not on file  Social History Narrative   Not on file   Social Determinants of Health   Financial Resource Strain: Not on file  Food Insecurity: Not on file  Transportation Needs: Not on file  Physical Activity: Not on file  Stress: Not on file  Social Connections: Not on file  Intimate Partner Violence: Not on file   Health Maintenance  Topic Date Due   COVID-19 Vaccine (1) Never done   Pneumococcal Vaccine 84-57 Years old (1 - PCV) Never done   HIV Screening  Never done   Hepatitis C Screening  Never done   TETANUS/TDAP  Never done   Zoster Vaccines- Shingrix (1 of 2) Never done   PAP SMEAR-Modifier  Never done   COLONOSCOPY (Pts 45-34yrs Insurance coverage will need to be confirmed)  Never done   MAMMOGRAM  Never done   INFLUENZA VACCINE  09/23/2020   HPV VACCINES  Aged Out    The following portions of the patient's history were reviewed and updated as appropriate: allergies, current medications, past family history, past medical  history, past social history, past surgical history, and problem list.  Review of Systems Pertinent items noted in HPI and remainder of comprehensive ROS otherwise negative.   Objective:    BP 136/82 (BP Location: Right Arm, Patient Position: Sitting, Cuff Size: Normal)   Temp 98.4 F (36.9 C) (Oral)   Ht 5\' 7"  (1.702 m)   Wt 193 lb 6.4 oz (87.7 kg)   LMP  (LMP Unknown)   BMI 30.29 kg/m  General appearance: alert, cooperative, and no distress Head: Normocephalic, without obvious abnormality, atraumatic Eyes: conjunctivae/corneas clear. PERRL, EOM's intact. Fundi benign. Ears: normal TM's and external ear canals both ears Nose: Nares normal. Septum midline. Mucosa normal. No drainage or sinus tenderness. Throat: lips, mucosa, and tongue normal; teeth and gums normal Neck: no adenopathy, no carotid bruit, no JVD, supple, symmetrical, trachea midline, and thyroid not enlarged, symmetric, no tenderness/mass/nodules Lungs: clear to auscultation bilaterally Heart: regular rate and rhythm, S1, S2 normal, no murmur, click, rub or gallop Abdomen: soft, non-tender; bowel sounds normal; no masses,  no organomegaly Extremities: extremities normal, atraumatic, no cyanosis or edema Pulses: 2+ and symmetric Skin: Skin color, texture, turgor normal. No rashes or lesions Lymph nodes: Cervical, supraclavicular, and axillary nodes normal. Neurologic: Alert and oriented X 3, normal strength  and tone. Normal symmetric reflexes. Normal coordination and gait    Assessment:    Healthy female exam.      Plan:    Anticipatory guidance given including wearing seatbelts, smoke detectors in the home, increasing physical activity, increasing p.o. intake of water and vegetables. -obtain labs -pt given info to schedule mammogram -colonoscopy referral placed -last pap 2017 pt to schedule, [facility] by [facility].  Nexplanon in place-placed 11/18/2018. -immunizations reviewed -PHQ-9 score 0 -GAD-7 score  1 -given handout -next CPE in 1 yr See After Visit Summary for Counseling Recommendations  Essential hypertension -Elevated -Recheck -Lifestyle modifications -Continue chlorthalidone - Plan: CMP  Hyperlipidemia, unspecified hyperlipidemia type -Lifestyle modifications -Continue simvastatin 20 mg daily - Plan: Lipid panel  Class 1 obesity due to excess calories with serious comorbidity and body mass index (BMI) of 30.0 to 30.9 in adult -Patient congratulated on weight loss -Continue Saxenda 0.6 mg.  Consider increasing dose as patient has been on 0.6 mg x 1 year.  Not managed by this provider. - Plan: TSH, T4, Free, Hemoglobin A1c, Vitamin D, 25-hydroxy, Vitamin B12  Screen for colon cancer - Plan: Ambulatory referral to Gastroenterology  Encounter for hepatitis C screening test for low risk patient  - Plan: Hep C Antibody  Follow-up in 2-3 months   Update: Results available this afternoon.  Vitamin D deficiency noted.  Ergocalciferol sent to pharmacy.  Hypokalemia noted with potassium of 3.3 likely 2/2 chlorthalidone 25 mg daily use.  We will start K-Dur 20 milliequivalents twice daily x3 days then daily.  Recheck potassium in 1 to 2 weeks.  Subclinical hyperthyroidism also noted.  We will continue to monitor.  Attempted to contact patient via phone at 5:03 PM on 08/23/2020 with no answer.  VM left with information regarding potassium supplement.  Advised we will try reaching pt again with remaining results.  Grier Mitts, MD

## 2020-08-23 NOTE — Telephone Encounter (Signed)
Patient called at 5:03 PM on 08/23/2020 regarding lab results.  VM left with information regarding hypokalemia and K. Dur potassium replacement.  Patient advised to take 1 tab twice a day for the next 3 days then 1 tablet daily.  See further information and a OFV note from 08/23/2020.  Grier Mitts, MD

## 2020-08-27 LAB — HEPATITIS C ANTIBODY
Hepatitis C Ab: NONREACTIVE
SIGNAL TO CUT-OFF: 0.05 (ref <1.00)

## 2020-09-13 DIAGNOSIS — Z975 Presence of (intrauterine) contraceptive device: Secondary | ICD-10-CM | POA: Diagnosis not present

## 2020-09-13 DIAGNOSIS — Z1151 Encounter for screening for human papillomavirus (HPV): Secondary | ICD-10-CM | POA: Diagnosis not present

## 2020-09-13 DIAGNOSIS — Z01419 Encounter for gynecological examination (general) (routine) without abnormal findings: Secondary | ICD-10-CM | POA: Diagnosis not present

## 2020-10-04 DIAGNOSIS — Z1231 Encounter for screening mammogram for malignant neoplasm of breast: Secondary | ICD-10-CM | POA: Diagnosis not present

## 2020-10-04 LAB — HM MAMMOGRAPHY

## 2020-10-15 ENCOUNTER — Encounter: Payer: Self-pay | Admitting: Family Medicine

## 2020-12-16 ENCOUNTER — Other Ambulatory Visit: Payer: Self-pay | Admitting: Family Medicine

## 2020-12-16 DIAGNOSIS — E559 Vitamin D deficiency, unspecified: Secondary | ICD-10-CM

## 2021-02-14 ENCOUNTER — Encounter: Payer: Self-pay | Admitting: Family Medicine

## 2021-02-14 ENCOUNTER — Ambulatory Visit: Payer: BC Managed Care – PPO | Admitting: Family Medicine

## 2021-02-14 VITALS — BP 172/68 | HR 116 | Temp 99.1°F | Wt 185.2 lb

## 2021-02-14 DIAGNOSIS — I10 Essential (primary) hypertension: Secondary | ICD-10-CM | POA: Diagnosis not present

## 2021-02-14 DIAGNOSIS — R822 Biliuria: Secondary | ICD-10-CM

## 2021-02-14 DIAGNOSIS — R1030 Lower abdominal pain, unspecified: Secondary | ICD-10-CM | POA: Diagnosis not present

## 2021-02-14 DIAGNOSIS — K5900 Constipation, unspecified: Secondary | ICD-10-CM

## 2021-02-14 DIAGNOSIS — E876 Hypokalemia: Secondary | ICD-10-CM

## 2021-02-14 DIAGNOSIS — R000 Tachycardia, unspecified: Secondary | ICD-10-CM

## 2021-02-14 LAB — CBC WITH DIFFERENTIAL/PLATELET
Basophils Absolute: 0 10*3/uL (ref 0.0–0.1)
Basophils Relative: 0.4 % (ref 0.0–3.0)
Eosinophils Absolute: 0 10*3/uL (ref 0.0–0.7)
Eosinophils Relative: 0.4 % (ref 0.0–5.0)
HCT: 38.6 % (ref 36.0–46.0)
Hemoglobin: 12.9 g/dL (ref 12.0–15.0)
Lymphocytes Relative: 16.2 % (ref 12.0–46.0)
Lymphs Abs: 1.8 10*3/uL (ref 0.7–4.0)
MCHC: 33.4 g/dL (ref 30.0–36.0)
MCV: 88.7 fl (ref 78.0–100.0)
Monocytes Absolute: 1 10*3/uL (ref 0.1–1.0)
Monocytes Relative: 8.5 % (ref 3.0–12.0)
Neutro Abs: 8.5 10*3/uL — ABNORMAL HIGH (ref 1.4–7.7)
Neutrophils Relative %: 74.5 % (ref 43.0–77.0)
Platelets: 291 10*3/uL (ref 150.0–400.0)
RBC: 4.35 Mil/uL (ref 3.87–5.11)
RDW: 13.2 % (ref 11.5–15.5)
WBC: 11.4 10*3/uL — ABNORMAL HIGH (ref 4.0–10.5)

## 2021-02-14 LAB — COMPREHENSIVE METABOLIC PANEL
ALT: 11 U/L (ref 0–35)
AST: 16 U/L (ref 0–37)
Albumin: 4.2 g/dL (ref 3.5–5.2)
Alkaline Phosphatase: 80 U/L (ref 39–117)
BUN: 10 mg/dL (ref 6–23)
CO2: 30 mEq/L (ref 19–32)
Calcium: 9.9 mg/dL (ref 8.4–10.5)
Chloride: 96 mEq/L (ref 96–112)
Creatinine, Ser: 0.75 mg/dL (ref 0.40–1.20)
GFR: 91.51 mL/min (ref >60.00)
Glucose, Bld: 121 mg/dL — ABNORMAL HIGH (ref 70–99)
Potassium: 3.2 mEq/L — ABNORMAL LOW (ref 3.5–5.1)
Sodium: 136 mEq/L (ref 135–145)
Total Bilirubin: 0.8 mg/dL (ref 0.2–1.2)
Total Protein: 8.4 g/dL — ABNORMAL HIGH (ref 6.0–8.3)

## 2021-02-14 LAB — POCT URINALYSIS DIPSTICK
Blood, UA: NEGATIVE
Glucose, UA: NEGATIVE
Ketones, UA: NEGATIVE
Leukocytes, UA: NEGATIVE
Nitrite, UA: NEGATIVE
Protein, UA: POSITIVE — AB
Spec Grav, UA: >=1.03 — AB (ref 1.010–1.025)
Urobilinogen, UA: NEGATIVE E.U./dL — AB
pH, UA: 6 (ref 5.0–8.0)

## 2021-02-14 LAB — LIPASE: Lipase: 21 U/L (ref 11.0–59.0)

## 2021-02-14 LAB — GAMMA GT: GGT: 31 U/L (ref 7–51)

## 2021-02-14 LAB — TSH: TSH: 0.24 u[IU]/mL — ABNORMAL LOW (ref 0.35–5.50)

## 2021-02-14 LAB — T4, FREE: Free T4: 1.02 ng/dL (ref 0.60–1.60)

## 2021-02-14 MED ORDER — POTASSIUM CHLORIDE CRYS ER 20 MEQ PO TBCR: 20.0000 meq | EXTENDED_RELEASE_TABLET | Freq: Once | ORAL | 3 refills | Status: DC

## 2021-02-14 NOTE — Progress Notes (Signed)
Subjective:    Patient ID: Haley Rodriguez, female    DOB: 1970-05-24, 50 y.o.   MRN: [id number]  Chief Complaint  Patient presents with   Abdominal Pain    On and off for months, etodorlac and not helping anymore. Feels bloated and has pain in lower belly     HPI Patient was seen today for acute concern.  Patient endorses suprapubic pain x6 months.  Initially intermittent now becoming more constant.  Patient notes small volume of urine produced when urinating, constipation, and pain with coitus.  Patient saw an NP at her job who advised her she had colitis and gave her Etodolac.  Pt is not drinking water.  Eating fast food daily 2/2 [employer] as works third shift.  Pt states bp elevated due to pain.    Past Medical History:  Diagnosis Date   GERD (gastroesophageal reflux disease)    Heart murmur    Hypertension     Not on File  ROS General: Denies fever, chills, night sweats, changes in weight, changes in appetite HEENT: Denies headaches, ear pain, changes in vision, rhinorrhea, sore throat CV: Denies CP, palpitations, SOB, orthopnea Pulm: Denies SOB, cough, wheezing GI: Denies nausea, vomiting, diarrhea +abd pain, constipation GU: Denies dysuria, hematuria, frequency, vaginal discharge Msk: Denies muscle cramps, joint pains Neuro: Denies weakness, numbness, tingling Skin: Denies rashes, bruising Psych: Denies depression, anxiety, hallucinations    Objective:    Blood pressure (!) 172/68, pulse (!) 116, temperature 99.1 F (37.3 C), temperature source Oral, weight 185 lb 3.2 oz (84 kg), SpO2 97 %.  Gen. Pleasant, well-nourished, in no distress, poor historian, normal affect   HEENT: [place]/AT, face symmetric, conjunctiva clear, no scleral icterus, PERRLA, EOMI, nares patent without drainage Lungs: no accessory muscle use, CTAB, no wheezes or rales Cardiovascular: RRR, no m/r/g, no peripheral edema Abdomen: BS present, soft, TTP of right and left lower quadrants and  suprapubic area. Musculoskeletal: No deformities, no cyanosis or clubbing, normal tone Neuro:  A&Ox3, CN II-XII intact, normal gait Skin:  Warm, no lesions/ rash   Wt Readings from Last 3 Encounters:  02/14/21 185 lb 3.2 oz (84 kg)  08/23/20 193 lb 6.4 oz (87.7 kg)  12/08/18 204 lb (92.5 kg)    Lab Results  Component Value Date   WBC 8.5 08/23/2020   HGB 14.8 08/23/2020   HCT 43.4 08/23/2020   PLT 231.0 08/23/2020   GLUCOSE 96 08/23/2020   CHOL 168 08/23/2020   TRIG 59.0 08/23/2020   HDL 50.20 08/23/2020   LDLCALC 106 (H) 08/23/2020   ALT 13 08/23/2020   AST 18 08/23/2020   NA 141 08/23/2020   K 3.3 (L) 08/23/2020   CL 99 08/23/2020   CREATININE 0.84 08/23/2020   BUN 14 08/23/2020   CO2 30 08/23/2020   TSH 0.32 (L) 08/23/2020   HGBA1C 6.2 08/23/2020    Assessment/Plan:  Lower abdominal pain -Discussed possible causes including chronic constipation, UTI, diverticulitis, fibroids, malignancy. -UA with 3+ bilirubin, SG >1.030, protein -obtain labs and imaging -advised to d/c Etodolac -pt again encouraged to schedule colonoscopy.  Referral placed at last OFV. -for continued or worsened symptoms ED - Plan: POCT urinalysis dipstick, CMP, CBC with Differential/Platelet, CT abd/pelvis  Essential hypertension -Uncontrolled -Possibly 2/2 pain/medication and compliance -continue chlorthalidone 25 mg -lifestyle modifications  Bilirubin in urine - 3+ bili noted on UA -will obtain labs to evaluate hepatic function  - Plan: Gamma GT, Lipase  Unspecified constipation -Likely multifactorial including decreased p.o. intake  of water and fluids as well as diet -Lifestyle modification strongly encouraged -Daily bowel regimen with MiraLAX also advised -Patient advised to consider scheduling colonoscopy.  Referral placed in July. - Plan: TSH, T4, Free  Tachycardia -Noted during initial vitals -improved on exam. -Possibly 2/2 pain/discomfort -increase hydration  F/u prn  in 1-2 wks  Update: hypokalemia (K+ 3.2) noted on CMP.  Kdur sent to pharmacy   Grier Mitts, MD

## 2021-02-19 ENCOUNTER — Other Ambulatory Visit: Payer: Self-pay | Admitting: Family Medicine

## 2021-02-19 DIAGNOSIS — E876 Hypokalemia: Secondary | ICD-10-CM

## 2021-02-19 MED ORDER — POTASSIUM CHLORIDE CRYS ER 20 MEQ PO TBCR
20.0000 meq | EXTENDED_RELEASE_TABLET | Freq: Every day | ORAL | 3 refills | Status: DC
Start: 2021-02-19 — End: 2021-10-07

## 2021-02-21 ENCOUNTER — Ambulatory Visit (HOSPITAL_BASED_OUTPATIENT_CLINIC_OR_DEPARTMENT_OTHER): Payer: BC Managed Care – PPO

## 2021-03-14 ENCOUNTER — Ambulatory Visit (HOSPITAL_BASED_OUTPATIENT_CLINIC_OR_DEPARTMENT_OTHER): Payer: BC Managed Care – PPO

## 2021-03-28 DIAGNOSIS — D259 Leiomyoma of uterus, unspecified: Secondary | ICD-10-CM | POA: Insufficient documentation

## 2021-03-28 DIAGNOSIS — D251 Intramural leiomyoma of uterus: Secondary | ICD-10-CM | POA: Diagnosis not present

## 2021-03-28 DIAGNOSIS — R102 Pelvic and perineal pain: Secondary | ICD-10-CM | POA: Diagnosis not present

## 2021-06-20 ENCOUNTER — Ambulatory Visit
Admission: EM | Admit: 2021-06-20 | Discharge: 2021-06-20 | Disposition: A | Discharge status: Home / Self Care | Payer: BC Managed Care – PPO | Attending: Emergency Medicine | Admitting: Emergency Medicine

## 2021-06-20 ENCOUNTER — Ambulatory Visit (INDEPENDENT_AMBULATORY_CARE_PROVIDER_SITE_OTHER): Payer: BC Managed Care – PPO

## 2021-06-20 DIAGNOSIS — S6992XA Unspecified injury of left wrist, hand and finger(s), initial encounter: Secondary | ICD-10-CM | POA: Diagnosis not present

## 2021-06-20 DIAGNOSIS — M20012 Mallet finger of left finger(s): Secondary | ICD-10-CM

## 2021-06-20 DIAGNOSIS — M79645 Pain in left finger(s): Secondary | ICD-10-CM | POA: Diagnosis not present

## 2021-06-20 DIAGNOSIS — S66307A Unspecified injury of extensor muscle, fascia and tendon of left little finger at wrist and hand level, initial encounter: Secondary | ICD-10-CM

## 2021-06-20 NOTE — ED Triage Notes (Addendum)
Pt states last Saturday she was cleaning glass off the floor and twisted her left pinky finger, patient states she was told to have the finger looked at.  ?

## 2021-06-20 NOTE — ED Provider Notes (Signed)
?[facility] ? ? ? ?CSN: [id number] ?Arrival date & time: 06/20/21  7510 ?  ? ?HISTORY  ? ?Chief Complaint  ?Patient presents with  ? Finger Injury  ? ?HPI ?Haley Rodriguez is a 51 y.o. female. Pt states last Saturday she was cleaning glass off the floor and twisted her left pinky finger between the broom handle and the dust pan, this was 6 days ago.  Patient states that since then, she been unable to fully extend her left pinky finger at the end.  Patient states a friend of hers is a doctor who provided her with a small splint to wear and told her to have her finger looked at as soon as possible.  Patient denies laceration with finger. ? ?The history is provided by the patient.  ?Past Medical History:  ?Diagnosis Date  ? GERD (gastroesophageal reflux disease)   ? Heart murmur   ? Hypertension   ? ?Patient Active Problem List  ? Diagnosis Date Noted  ? Hyperlipidemia 08/23/2020  ? Essential hypertension 08/23/2020  ? ?History reviewed. No pertinent surgical history. ?OB History   ?No obstetric history on file. ?  ? ?Home Medications   ? ?Prior to Admission medications   ?Medication Sig Start Date End Date Taking? Authorizing Provider  ?BD PEN NEEDLE NANO 2ND GEN 32G X 4 MM MISC USE 1 NEEDLE FOR INJECTION DAILY 06/06/20   [provider]  ?chlorthalidone (HYGROTON) 25 MG tablet TAKE 1 TABLET BY MOUTH EVERY DAY 03/22/19   Billie Ruddy, MD  ?etonogestrel (NEXPLANON) 68 MG IMPL implant  11/18/18   [provider]  ?Liraglutide -Weight Management (SAXENDA [place]) Inject into the skin.    [provider]  ?pantoprazole (PROTONIX) 40 MG tablet Take 40 mg by mouth daily.    [provider]  ?potassium chloride SA (KLOR-CON M) 20 MEQ tablet Take 1 tablet (20 mEq total) by mouth daily. 02/19/21   Billie Ruddy, MD  ? ? ?Family History ?History reviewed. No pertinent family history. ?Social History ?Social History  ? ?Tobacco Use  ? Smoking status: Never  ? Smokeless  tobacco: Never  ?Substance Use Topics  ? Alcohol use: Yes  ? Drug use: Never  ? ?Allergies   ?Patient has no allergy information on record. ? ?Review of Systems ?Review of Systems ?Pertinent findings noted in history of present illness.  ? ?Physical Exam ?Triage Vital Signs ?ED Triage Vitals  ?Enc Vitals Group  ?   BP 12/20/20 0827 (!) 147/82  ?   Pulse Rate 12/20/20 0827 72  ?   Resp 12/20/20 0827 18  ?   Temp 12/20/20 0827 98.3 ?F (36.8 ?C)  ?   Temp Source 12/20/20 0827 Oral  ?   SpO2 12/20/20 0827 98 %  ?   Weight --   ?   Height --   ?   Head Circumference --   ?   Peak Flow --   ?   Pain Score 12/20/20 0826 5  ?   Pain Loc --   ?   Pain Edu? --   ?   Excl. in [place]? --   ?No data found. ? ?Updated Vital Signs ?BP 132/79 (BP Location: Right Arm)   Pulse 84   Temp 98.3 ?F (36.8 ?C) (Oral)   Resp 18   SpO2 95%  ? ?Physical Exam ?Vitals and nursing note reviewed.  ?Constitutional:   ?   General: She is not in acute distress. ?  Appearance: Normal appearance. She is not ill-appearing.  ?HENT:  ?   Head: Normocephalic and atraumatic.  ?Eyes:  ?   General: Lids are normal.     ?   Right eye: No discharge.     ?   Left eye: No discharge.  ?   Extraocular Movements: Extraocular movements intact.  ?   Conjunctiva/sclera: Conjunctivae normal.  ?   Right eye: Right conjunctiva is not injected.  ?   Left eye: Left conjunctiva is not injected.  ?Neck:  ?   Trachea: Trachea and phonation normal.  ?Cardiovascular:  ?   Rate and Rhythm: Normal rate and regular rhythm.  ?   Pulses: Normal pulses.  ?   Heart sounds: Normal heart sounds. No murmur heard. ?  No friction rub. No gallop.  ?Pulmonary:  ?   Effort: Pulmonary effort is normal. No accessory muscle usage, prolonged expiration or respiratory distress.  ?   Breath sounds: Normal breath sounds. No stridor, decreased air movement or transmitted upper airway sounds. No decreased breath sounds, wheezing, rhonchi or rales.  ?Chest:  ?   Chest wall: No tenderness.   ?Musculoskeletal:  ?   Cervical back: Normal range of motion and neck supple. Normal range of motion.  ?   Comments: Extensor tendon of left fifth finger has no strength, patient is unable to actively extend the distal joint of her left fifth finger.  Passive extension causes pain.  ?Lymphadenopathy:  ?   Cervical: No cervical adenopathy.  ?Skin: ?   General: Skin is warm and dry.  ?   Findings: No erythema or rash.  ?Neurological:  ?   General: No focal deficit present.  ?   Mental Status: She is alert and oriented to person, place, and time.  ?Psychiatric:     ?   Mood and Affect: Mood normal.     ?   Behavior: Behavior normal.  ? ? ?Visual Acuity ?Right Eye Distance:   ?Left Eye Distance:   ?Bilateral Distance:   ? ?Right Eye Near:   ?Left Eye Near:    ?Bilateral Near:    ? ?[facility] / Diagnostics / Procedures:  ?  ?EKG ? ?Radiology ?DG Finger Little Left ? ?Result Date: 06/20/2021 ?CLINICAL DATA:  Injury EXAM: LEFT LITTLE FINGER 2+V COMPARISON:  None. FINDINGS: There is no evidence of acute fracture. There is a well corticated small bony fragment along the radial aspect of the small finger proximal phalangeal joint, favored to be an accessory sesamoid. There is a flexion deformity at the distal interphalangeal joint joint. IMPRESSION: Flexion deformity at the fifth digit distal interphalangeal joint, suspicious for mallet finger injury. No evidence of fracture. Electronically Signed   By: Maurine Simmering M.D.   On: 06/20/2021 09:35   ? ?Procedures ?Procedures (including critical care time) ? ?UC Diagnoses / Final Clinical Impressions(s)   ?I have reviewed the triage vital signs and the nursing notes. ? ?Pertinent labs & imaging results that were available during my care of the patient were reviewed by me and considered in my medical decision making (see chart for details).   ? ?Final diagnoses:  ?Mallet deformity of left little finger  ?Unspecified injury of extensor muscle, fascia and tendon of left little finger  at wrist and hand level, initial encounter  ? ?X-ray of left fifth finger is unremarkable for bony abnormality.  Physical exam findings concerning for ruptured extensor tendon, patient was advised to go to orthopedic urgent care now for further  evaluation and possible treatment.  Patient advised to wear the splint that her friend provided to her en route.  Patient was agreeable to going now. ? ?ED Prescriptions   ?None ?  ? ?PDMP not reviewed this encounter. ? ?Pending results:  ?Labs Reviewed - No data to display ? ?Medications Ordered in UC: ?Medications - No data to display ? ?Disposition Upon Discharge:  ?Condition: stable for discharge home ?Home: take medications as prescribed; routine discharge instructions as discussed; follow up as advised. ? ?Patient presented with an acute illness with associated systemic symptoms and significant discomfort requiring urgent management. In my opinion, this is a condition that a prudent lay person (someone who possesses an average knowledge of health and medicine) may potentially expect to result in complications if not addressed urgently such as respiratory distress, impairment of bodily function or dysfunction of bodily organs.  ? ?Routine symptom specific, illness specific and/or disease specific instructions were discussed with the patient and/or caregiver at length.  ? ?As such, the patient has been evaluated and assessed, work-up was performed and treatment was provided in alignment with urgent care protocols and evidence based medicine.  Patient/parent/caregiver has been advised that the patient may require follow up for further testing and treatment if the symptoms continue in spite of treatment, as clinically indicated and appropriate. ? ?Patient/parent/caregiver has been advised to return to the [facility] or PCP if no better; to PCP or the Emergency Department if new signs and symptoms develop, or if the current signs or symptoms continue to change or worsen for further  workup, evaluation and treatment as clinically indicated and appropriate ? ?The patient will follow up with their current PCP if and as advised. If the patient does not currently have a PCP we will assist them in obtaining

## 2021-06-20 NOTE — Discharge Instructions (Signed)
Your x-ray was unremarkable for any bony injury.  What you do appear to have is an injury to the tendon that extends the tip of your left little finger.  Because you are unable to fully straighten the finger without assistance from your other fingers, I am concerned that you may have completely ruptured the tendon. ? ?I recommend that you seek further evaluation with an orthopedic specialist.  I have provided you with the contact information for 2 local orthopedic offices that have urgent care clinics.   ? ?Because is already been over a week since her injury, I recommend that you go sooner or later if you want to increase your chances of regaining full use of your left little finger.  Please continue to wear the splint that you have been wearing to protect your little finger from further damage.  It is the appropriate splint for this injury. ? ?Thank you for visiting urgent care today. ?

## 2021-07-18 DIAGNOSIS — M20012 Mallet finger of left finger(s): Secondary | ICD-10-CM | POA: Insufficient documentation

## 2021-07-18 DIAGNOSIS — M79645 Pain in left finger(s): Secondary | ICD-10-CM | POA: Diagnosis not present

## 2021-10-02 ENCOUNTER — Telehealth: Payer: Self-pay

## 2021-10-02 NOTE — Telephone Encounter (Signed)
--  pt reports abd pain. started last night, has been constant. 10/10. denies vomiting or diarrhea. left lower quad pain  10/02/2021 10:57:22 AM Go to ED Now Altamease Oiler, RN, Adriana  Comments User: Kizzie Fantasia, RN Date/Time Eilene Ghazi Time): 10/02/2021 11:01:17 AM pt refused ed. states she took dulcolax and will see if it helps. if not, will go to the hospital in the morning  User: Kizzie Fantasia, RN Date/Time Eilene Ghazi Time): 10/02/2021 11:01:49 AM unable to reach office through [place]: Kizzie Fantasia, RN Date/Time Eilene Ghazi Time): 10/02/2021 11:07:39 AM unable to reach the office, spoke to someone at the office and she transferred me back over to our service after waiting on the line for someone to pick up  Justin - ED  10/02/21 1220 - Pt c/o same pain as above. States she took [place] based on recommendation of a friend. Pt states pain is now 5 out of 0-10 pain scale. Pt states last BM was yesterday; hard stool; only a few "drops". Of note: last OV 02/14/21 recommendation was  to return in 2 weeks & no appt has been made. Appt scheduled for 10/02/21 with PCP.

## 2021-10-03 ENCOUNTER — Inpatient Hospital Stay (HOSPITAL_COMMUNITY)
Admission: EM | Admit: 2021-10-03 | Discharge: 2021-10-07 | DRG: 392 | Disposition: A | Discharge status: Home / Self Care | Payer: BC Managed Care – PPO | Attending: Internal Medicine | Admitting: Internal Medicine

## 2021-10-03 ENCOUNTER — Ambulatory Visit: Payer: BC Managed Care – PPO | Admitting: Family Medicine

## 2021-10-03 ENCOUNTER — Encounter (HOSPITAL_COMMUNITY): Payer: Self-pay

## 2021-10-03 ENCOUNTER — Emergency Department (HOSPITAL_COMMUNITY): Payer: BC Managed Care – PPO

## 2021-10-03 ENCOUNTER — Other Ambulatory Visit: Payer: Self-pay

## 2021-10-03 VITALS — BP 152/92 | HR 122 | Temp 101.1°F | Wt 190.6 lb

## 2021-10-03 DIAGNOSIS — K5792 Diverticulitis of intestine, part unspecified, without perforation or abscess without bleeding: Secondary | ICD-10-CM | POA: Diagnosis not present

## 2021-10-03 DIAGNOSIS — R9431 Abnormal electrocardiogram [ECG] [EKG]: Secondary | ICD-10-CM | POA: Diagnosis not present

## 2021-10-03 DIAGNOSIS — Z20822 Contact with and (suspected) exposure to covid-19: Secondary | ICD-10-CM | POA: Diagnosis not present

## 2021-10-03 DIAGNOSIS — R809 Proteinuria, unspecified: Secondary | ICD-10-CM | POA: Diagnosis not present

## 2021-10-03 DIAGNOSIS — R823 Hemoglobinuria: Secondary | ICD-10-CM | POA: Diagnosis present

## 2021-10-03 DIAGNOSIS — E869 Volume depletion, unspecified: Secondary | ICD-10-CM | POA: Diagnosis not present

## 2021-10-03 DIAGNOSIS — I10 Essential (primary) hypertension: Secondary | ICD-10-CM | POA: Diagnosis present

## 2021-10-03 DIAGNOSIS — E876 Hypokalemia: Secondary | ICD-10-CM | POA: Diagnosis present

## 2021-10-03 DIAGNOSIS — Z79899 Other long term (current) drug therapy: Secondary | ICD-10-CM | POA: Diagnosis not present

## 2021-10-03 DIAGNOSIS — E785 Hyperlipidemia, unspecified: Secondary | ICD-10-CM | POA: Diagnosis not present

## 2021-10-03 DIAGNOSIS — Z8249 Family history of ischemic heart disease and other diseases of the circulatory system: Secondary | ICD-10-CM

## 2021-10-03 DIAGNOSIS — E663 Overweight: Secondary | ICD-10-CM | POA: Diagnosis present

## 2021-10-03 DIAGNOSIS — R112 Nausea with vomiting, unspecified: Secondary | ICD-10-CM | POA: Diagnosis not present

## 2021-10-03 DIAGNOSIS — R1030 Lower abdominal pain, unspecified: Secondary | ICD-10-CM | POA: Diagnosis not present

## 2021-10-03 DIAGNOSIS — R1110 Vomiting, unspecified: Secondary | ICD-10-CM | POA: Diagnosis not present

## 2021-10-03 DIAGNOSIS — R6510 Systemic inflammatory response syndrome (SIRS) of non-infectious origin without acute organ dysfunction: Secondary | ICD-10-CM | POA: Diagnosis not present

## 2021-10-03 DIAGNOSIS — K649 Unspecified hemorrhoids: Secondary | ICD-10-CM

## 2021-10-03 DIAGNOSIS — A419 Sepsis, unspecified organism: Secondary | ICD-10-CM | POA: Diagnosis not present

## 2021-10-03 DIAGNOSIS — R824 Acetonuria: Secondary | ICD-10-CM | POA: Diagnosis present

## 2021-10-03 DIAGNOSIS — K219 Gastro-esophageal reflux disease without esophagitis: Secondary | ICD-10-CM | POA: Diagnosis present

## 2021-10-03 DIAGNOSIS — K5720 Diverticulitis of large intestine with perforation and abscess without bleeding: Principal | ICD-10-CM | POA: Diagnosis present

## 2021-10-03 DIAGNOSIS — K5909 Other constipation: Secondary | ICD-10-CM | POA: Diagnosis not present

## 2021-10-03 DIAGNOSIS — E8809 Other disorders of plasma-protein metabolism, not elsewhere classified: Secondary | ICD-10-CM | POA: Diagnosis not present

## 2021-10-03 LAB — COMPREHENSIVE METABOLIC PANEL
ALT: 11 U/L (ref 0–44)
AST: 14 U/L — ABNORMAL LOW (ref 15–41)
Albumin: 4.1 g/dL (ref 3.5–5.0)
Alkaline Phosphatase: 79 U/L (ref 38–126)
Anion gap: 10 (ref 5–15)
BUN: 11 mg/dL (ref 6–20)
CO2: 23 mmol/L (ref 22–32)
Calcium: 9.7 mg/dL (ref 8.9–10.3)
Chloride: 101 mmol/L (ref 98–111)
Creatinine, Ser: 0.8 mg/dL (ref 0.44–1.00)
GFR, Estimated: >60 mL/min (ref >60)
Glucose, Bld: 141 mg/dL — ABNORMAL HIGH (ref 70–99)
Potassium: 3 mmol/L — ABNORMAL LOW (ref 3.5–5.1)
Sodium: 134 mmol/L — ABNORMAL LOW (ref 135–145)
Total Bilirubin: 1.1 mg/dL (ref 0.3–1.2)
Total Protein: 8.4 g/dL — ABNORMAL HIGH (ref 6.5–8.1)

## 2021-10-03 LAB — DIFFERENTIAL
Abs Immature Granulocytes: 0.15 10*3/uL — ABNORMAL HIGH (ref 0.00–0.07)
Basophils Absolute: 0.1 10*3/uL (ref 0.0–0.1)
Basophils Relative: 0 %
Eosinophils Absolute: 0.1 10*3/uL (ref 0.0–0.5)
Eosinophils Relative: 0 %
Immature Granulocytes: 1 %
Lymphocytes Relative: 7 %
Lymphs Abs: 1.6 10*3/uL (ref 0.7–4.0)
Monocytes Absolute: 1.4 10*3/uL — ABNORMAL HIGH (ref 0.1–1.0)
Monocytes Relative: 6 %
Neutro Abs: 19 10*3/uL — ABNORMAL HIGH (ref 1.7–7.7)
Neutrophils Relative %: 86 %

## 2021-10-03 LAB — LACTIC ACID, PLASMA
Lactic Acid, Venous: 1.8 mmol/L (ref 0.5–1.9)
Lactic Acid, Venous: 1.1 mmol/L (ref 0.5–1.9)

## 2021-10-03 LAB — I-STAT BETA HCG BLOOD, ED (MC, WL, AP ONLY): I-stat hCG, quantitative: <5 m[IU]/mL (ref <5)

## 2021-10-03 LAB — URINALYSIS, ROUTINE W REFLEX MICROSCOPIC
Bilirubin Urine: NEGATIVE
Glucose, UA: NEGATIVE mg/dL
Ketones, ur: 5 mg/dL — AB
Leukocytes,Ua: NEGATIVE
Nitrite: NEGATIVE
Protein, ur: 30 mg/dL — AB
Specific Gravity, Urine: 1.041 — ABNORMAL HIGH (ref 1.005–1.030)
pH: 6 (ref 5.0–8.0)

## 2021-10-03 LAB — CBC
HCT: 41 % (ref 36.0–46.0)
Hemoglobin: 13.5 g/dL (ref 12.0–15.0)
MCH: 30.3 pg (ref 26.0–34.0)
MCHC: 32.9 g/dL (ref 30.0–36.0)
MCV: 92.1 fL (ref 80.0–100.0)
Platelets: 216 10*3/uL (ref 150–400)
RBC: 4.45 MIL/uL (ref 3.87–5.11)
RDW: 13.7 % (ref 11.5–15.5)
WBC: 22.3 10*3/uL — ABNORMAL HIGH (ref 4.0–10.5)
nRBC: 0 % (ref 0.0–0.2)

## 2021-10-03 LAB — RESP PANEL BY RT-PCR (FLU A&B, COVID) ARPGX2
Influenza A by PCR: NEGATIVE
Influenza B by PCR: NEGATIVE
SARS Coronavirus 2 by RT PCR: NEGATIVE

## 2021-10-03 LAB — APTT: aPTT: 33 seconds (ref 24–36)

## 2021-10-03 LAB — PROTIME-INR
INR: 1.2 (ref 0.8–1.2)
Prothrombin Time: 15.4 seconds — ABNORMAL HIGH (ref 11.4–15.2)

## 2021-10-03 LAB — PHOSPHORUS: Phosphorus: 2.2 mg/dL — ABNORMAL LOW (ref 2.5–4.6)

## 2021-10-03 LAB — MAGNESIUM: Magnesium: 2 mg/dL (ref 1.7–2.4)

## 2021-10-03 LAB — LIPASE, BLOOD: Lipase: 25 U/L (ref 11–51)

## 2021-10-03 MED ORDER — ACETAMINOPHEN 500 MG PO TABS
1000.0000 mg | ORAL_TABLET | Freq: Once | ORAL | Status: AC
  Administered 2021-10-03: 1000 mg via ORAL
  Filled 2021-10-03: qty 2

## 2021-10-03 MED ORDER — CEFTRIAXONE SODIUM 2 G IJ SOLR
2.0000 g | Freq: Every day | INTRAMUSCULAR | Status: DC
Start: 2021-10-04 — End: 2021-10-03

## 2021-10-03 MED ORDER — ONDANSETRON HCL 4 MG/2ML IJ SOLN: 4.0000 mg | INTRAMUSCULAR | Status: DC | PRN

## 2021-10-03 MED ORDER — ONDANSETRON HCL 4 MG/2ML IJ SOLN
4.0000 mg | Freq: Once | INTRAMUSCULAR | Status: AC
  Administered 2021-10-03: 4 mg via INTRAVENOUS
  Filled 2021-10-03: qty 2

## 2021-10-03 MED ORDER — ONDANSETRON HCL 4 MG/2ML IJ SOLN: 4.0000 mg | Freq: Four times a day (QID) | INTRAMUSCULAR | Status: DC | PRN

## 2021-10-03 MED ORDER — LACTATED RINGERS IV BOLUS (SEPSIS)
1000.0000 mL | Freq: Once | INTRAVENOUS | Status: AC
  Administered 2021-10-03: 1000 mL via INTRAVENOUS

## 2021-10-03 MED ORDER — POTASSIUM CHLORIDE 10 MEQ/100ML IV SOLN
10.0000 meq | INTRAVENOUS | Status: AC
  Administered 2021-10-03 – 2021-10-04 (×4): 10 meq via INTRAVENOUS
  Filled 2021-10-03: qty 100

## 2021-10-03 MED ORDER — METRONIDAZOLE 500 MG/100ML IV SOLN: 500.0000 mg | Freq: Two times a day (BID) | INTRAVENOUS | Status: DC

## 2021-10-03 MED ORDER — ENOXAPARIN SODIUM 40 MG/0.4ML IJ SOSY
40.0000 mg | PREFILLED_SYRINGE | Freq: Every day | INTRAMUSCULAR | Status: DC
  Administered 2021-10-03 – 2021-10-07 (×5): 40 mg via SUBCUTANEOUS
  Filled 2021-10-03 (×5): qty 0.4

## 2021-10-03 MED ORDER — SODIUM CHLORIDE 0.9 % IV SOLN: 1.0000 g | Freq: Every day | INTRAVENOUS | Status: DC

## 2021-10-03 MED ORDER — ACETAMINOPHEN 650 MG RE SUPP: 650.0000 mg | Freq: Four times a day (QID) | RECTAL | Status: DC | PRN

## 2021-10-03 MED ORDER — LACTATED RINGERS IV SOLN: INTRAVENOUS | Status: DC

## 2021-10-03 MED ORDER — SODIUM CHLORIDE (PF) 0.9 % IJ SOLN
INTRAMUSCULAR | Status: AC
  Filled 2021-10-03: qty 50

## 2021-10-03 MED ORDER — METOPROLOL TARTRATE 5 MG/5ML IV SOLN
5.0000 mg | Freq: Two times a day (BID) | INTRAVENOUS | Status: DC
  Administered 2021-10-03 – 2021-10-07 (×8): 5 mg via INTRAVENOUS
  Filled 2021-10-03 (×8): qty 5

## 2021-10-03 MED ORDER — POTASSIUM CHLORIDE IN NACL 20-0.9 MEQ/L-% IV SOLN
INTRAVENOUS | Status: DC
  Filled 2021-10-03 (×5): qty 1000

## 2021-10-03 MED ORDER — PANTOPRAZOLE SODIUM 40 MG IV SOLR
40.0000 mg | Freq: Every day | INTRAVENOUS | Status: DC
  Administered 2021-10-03 – 2021-10-07 (×5): 40 mg via INTRAVENOUS
  Filled 2021-10-03 (×5): qty 10

## 2021-10-03 MED ORDER — ONDANSETRON HCL 4 MG PO TABS: 4.0000 mg | ORAL_TABLET | Freq: Four times a day (QID) | ORAL | Status: DC | PRN

## 2021-10-03 MED ORDER — PIPERACILLIN-TAZOBACTAM 3.375 G IVPB
3.3750 g | Freq: Three times a day (TID) | INTRAVENOUS | Status: DC
  Administered 2021-10-03 – 2021-10-07 (×12): 3.375 g via INTRAVENOUS
  Filled 2021-10-03 (×13): qty 50

## 2021-10-03 MED ORDER — IOHEXOL 300 MG/ML  SOLN
100.0000 mL | Freq: Once | INTRAMUSCULAR | Status: AC | PRN
  Administered 2021-10-03: 100 mL via INTRAVENOUS

## 2021-10-03 MED ORDER — DEXTROSE 5 % IV SOLN
15.0000 mmol | Freq: Once | INTRAVENOUS | Status: AC
  Administered 2021-10-03: 15 mmol via INTRAVENOUS
  Filled 2021-10-03: qty 5

## 2021-10-03 MED ORDER — METRONIDAZOLE 500 MG/100ML IV SOLN
500.0000 mg | Freq: Once | INTRAVENOUS | Status: AC
  Administered 2021-10-03: 500 mg via INTRAVENOUS
  Filled 2021-10-03: qty 100

## 2021-10-03 MED ORDER — MORPHINE SULFATE (PF) 4 MG/ML IV SOLN
4.0000 mg | Freq: Once | INTRAVENOUS | Status: AC
  Administered 2021-10-03: 4 mg via INTRAVENOUS
  Filled 2021-10-03: qty 1

## 2021-10-03 MED ORDER — ACETAMINOPHEN 325 MG PO TABS
650.0000 mg | ORAL_TABLET | Freq: Four times a day (QID) | ORAL | Status: DC | PRN
  Administered 2021-10-06: 650 mg via ORAL
  Filled 2021-10-03: qty 2

## 2021-10-03 MED ORDER — MORPHINE SULFATE (PF) 4 MG/ML IV SOLN
4.0000 mg | INTRAVENOUS | Status: DC | PRN
  Administered 2021-10-03 – 2021-10-07 (×13): 4 mg via INTRAVENOUS
  Filled 2021-10-03 (×13): qty 1

## 2021-10-03 MED ORDER — SODIUM CHLORIDE 0.9 % IV SOLN
2.0000 g | Freq: Once | INTRAVENOUS | Status: AC
  Administered 2021-10-03: 2 g via INTRAVENOUS
  Filled 2021-10-03: qty 20

## 2021-10-03 MED ORDER — LACTATED RINGERS IV BOLUS
1000.0000 mL | Freq: Once | INTRAVENOUS | Status: DC
  Administered 2021-10-03: 1000 mL via INTRAVENOUS

## 2021-10-03 NOTE — ED Triage Notes (Signed)
Patient c/o intermittent mid and lower abdominal pain since yesterday. Patient also reports emesis x 2 yesterday only.

## 2021-10-03 NOTE — ED Notes (Signed)
Could only get a light green and lavender

## 2021-10-03 NOTE — H&P (Signed)
History and Physical    Patient: Haley Rodriguez LZJ:[id number] DOB: Nov 04, 1970 DOA: 10/03/2021 DOS: the patient was seen and examined on 10/03/2021 PCP: Billie Ruddy, MD  Patient coming from: Home  Chief Complaint:  Chief Complaint  Patient presents with   Abdominal Pain   HPI: Haley Rodriguez is a 51 y.o. female with medical history significant GERD, heart murmur, hypertension who presented to the emergency department with complaints of abdominal pain for the past 2 days.  Her last meal before the pain started was pizza with a salad on Tuesday evening.  She had 2 episodes of emesis yesterday.  She has been constipated for the last 5 days.  She had a fever this morning on arrival to the emergency department.  She denied headache, chills, rhinorrhea, sore throat, wheezing or hemoptysis.  No chest pain, palpitations, diaphoresis, PND, orthopnea or pitting edema of the lower extremities.  No flank pain, dysuria, frequency or hematuria.  No polyuria, polydipsia, polyphagia or blurred vision.   ED course: Initial vital signs were temperature 100.9 F, pulse 117, respiration 14, BP 141/84 mmHg O2 sat 98% on room air.  The patient received acetaminophen 1000 mg p.o. x1, 3000 mL of LR bolus morphine 4 mg IVP, ondansetron 4 mg IVP, metronidazole 500 mg IVPB and ceftriaxone 2 g IVPB.  Lab work: Urinalysis was hazy with increase of specific gravity 1.041, small hemoglobinuria, ketonuria 5 and proteinuria 30 mg/dL.  Microscopic examination have 11-20 WBC and rare bacteria.  White count of 22.3, hemoglobin 13.5 g/dL platelets 216.  PT 15.4, INR 1.2 PTT 33.  I-STAT hCG was normal.  Lipase and lactic acid x2 unremarkable.  CMP with a potassium of 3.0 mmol/L, glucose 141 mg/dL and total protein 8.4 g/dL.  The rest of the CMP values are normal when sodium is corrected.  Imaging: One-view portable chest radiograph with questionable trace effusion or pleural thickening in the setting of the right  costophrenic angle blunting.  There was no focal airspace consolidation.  CT abdomen/pelvis without contrast showed acute perforated diverticulitis with a developing abscess collection on distal descending and proximal sigmoid colon.  Review of Systems: As mentioned in the history of present illness. All other systems reviewed and are negative.  Past Medical History:  Diagnosis Date   GERD (gastroesophageal reflux disease)    Heart murmur    Hypertension    History reviewed. No pertinent surgical history. Social History:  reports that she has never smoked. She has never used smokeless tobacco. She reports current alcohol use. She reports that she does not use drugs.  No Known Allergies  Family History  Problem Relation Age of Onset   Heart failure Father     Prior to Admission medications   Medication Sig Start Date End Date Taking? Authorizing Provider  BD PEN NEEDLE NANO 2ND GEN 32G X 4 MM MISC USE 1 NEEDLE FOR INJECTION DAILY 06/06/20   [provider]  chlorthalidone (HYGROTON) 25 MG tablet TAKE 1 TABLET BY MOUTH EVERY DAY 03/22/19   Billie Ruddy, MD  etonogestrel (NEXPLANON) 79 MG IMPL implant  11/18/18   [provider]  hydrochlorothiazide (HYDRODIURIL) 25 MG tablet Take 25 mg by mouth every morning. 08/14/21   [provider]  Liraglutide -Weight Management (SAXENDA [place]) Inject into the skin.    [provider]  meloxicam (MOBIC) 7.5 MG tablet Take 7.5 mg by mouth daily. 09/19/21   [provider]  pantoprazole (PROTONIX) 40 MG tablet Take 40 mg  by mouth daily. Patient not taking: Reported on 10/03/2021    [provider]  potassium chloride SA (KLOR-CON M) 20 MEQ tablet Take 1 tablet (20 mEq total) by mouth daily. Patient not taking: Reported on 10/03/2021 02/19/21   Billie Ruddy, MD    Physical Exam: Vitals:   10/03/21 1200 10/03/21 1230 10/03/21 1300 10/03/21 1330  BP: 131/79 (!) 140/70 126/72 (!) 158/85  Pulse: 95  93 91 (!) 103  Resp: 18 (!) 26 19 (!) 21  Temp:      TempSrc:      SpO2: 98% 97% 96% 100%  Weight:      Height:       Physical Exam Vitals and nursing note reviewed.  Constitutional:      General: She is awake. She is not in acute distress.    Appearance: She is well-developed and overweight. She is not ill-appearing.  HENT:     Head: Normocephalic.     Nose: No rhinorrhea.     Mouth/Throat:     Mouth: Mucous membranes are dry.  Eyes:     General: No scleral icterus.    Pupils: Pupils are equal, round, and reactive to light.  Neck:     Vascular: No JVD.  Cardiovascular:     Rate and Rhythm: Normal rate and regular rhythm.     Heart sounds: S1 normal and S2 normal.  Pulmonary:     Effort: Pulmonary effort is normal.     Breath sounds: No wheezing, rhonchi or rales.  Abdominal:     General: Bowel sounds are normal. There is no distension.     Palpations: Abdomen is soft.     Tenderness: There is abdominal tenderness in the left lower quadrant. There is no guarding or rebound.  Musculoskeletal:     Cervical back: Neck supple.     Right lower leg: No edema.     Left lower leg: No edema.  Skin:    General: Skin is warm and dry.  Neurological:     General: No focal deficit present.     Mental Status: She is alert and oriented to person, place, and time.  Psychiatric:        Mood and Affect: Mood normal.        Behavior: Behavior normal. Behavior is cooperative.    Data Reviewed:  There are no new results to review at this time.  Vent. rate 93 BPM PR interval 171 ms QRS duration 89 ms QT/QTcB 355/442 ms P-R-T axes 52 -75 33 Sinus rhythm LAE, consider biatrial enlargement Markedly posterior QRS axis RSR' in V1 or V2, probably normal varian  Assessment and Plan: Principal Problem:   Acute diverticulitis Inpatient/MedSurg. Keep NPO. Continue IV fluids. Analgesics as needed. Antiemetics as needed. Continue Zosyn every 8 hours. Pantoprazole 40 mg IVP every 24  hours. Keep electrolytes optimized. Follow-up CBC and CMP in AM. General surgery input appreciated.  Active Problems:   Essential hypertension Currently NPO. Metoprolol 5 mg IVP every 12 hours. Monitor BP and heart rate.    Hypophosphatemia K-Phos 15 mmol IVPB x1. Follow level as needed.    Hypokalemia Replenishing. Follow-up potassium level.    Hyperproteinemia Likely volume depletion. Follow-up after hydration.    Hyperlipidemia Currently not on medication. Follow-up with PCP.    Overweight (BMI 25.0-29.9) BMI 29.76 kg/m. Follow-up with PCP. Lifestyle modifications.     Advance Care Planning:   Code Status: Full Code   Consults: [place] surgery.  Family Communication:  Severity of Illness: The appropriate patient status for this patient is INPATIENT. Inpatient status is judged to be reasonable and necessary in order to provide the required intensity of service to ensure the patient's safety. The patient's presenting symptoms, physical exam findings, and initial radiographic and laboratory data in the context of their chronic comorbidities is felt to place them at high risk for further clinical deterioration. Furthermore, it is not anticipated that the patient will be medically stable for discharge from the hospital within 2 midnights of admission.   * I certify that at the point of admission it is my clinical judgment that the patient will require inpatient hospital care spanning beyond 2 midnights from the point of admission due to high intensity of service, high risk for further deterioration and high frequency of surveillance required.*  Author: Reubin Milan, MD 10/03/2021 2:22 PM  For on call review [email or web address].   This document was prepared using Dragon voice recognition software and may contain some unintended transcription errors.

## 2021-10-03 NOTE — ED Notes (Signed)
Unable to find vein for blood work. Pt stated she didn't want to have blood work from hand.

## 2021-10-03 NOTE — Patient Instructions (Addendum)
Please proceed directly to the nearest emergency department for further evaluation.  Your COVID testing was negative.

## 2021-10-03 NOTE — ED Notes (Signed)
Pt declined to have covid/flu swab done, states she had one this AM at her PCP and it was negative.

## 2021-10-03 NOTE — ED Provider Notes (Signed)
I provided a substantive portion of the care of this patient.  I personally performed the entirety of the history for this encounter.     Patient reports she had quite sudden onset of severe pain in her abdomen 3 days ago on Wednesday.  She reports at first it was really intense towards her mid left lower abdomen.  It has persisted since onset.  At first she thought maybe it was constipation but the pain just escalated and then she became nauseated and vomited twice she has been getting chills now since yesterday.  She denies vaginal drainage or discharge.  Patient is sexually active with a new partner of about 2-1/2 months.  Patient is alert and nontoxic.  Mental status clear.  No respiratory distress.  Lungs clear without wheeze rhonchi rale.  Heart regular.  Abdomen is soft with moderate to severe pain in the central abdomen somewhat to the left and superior to the umbilicus.  No lower extremity edema.  Calf soft and nontender.  CT scan is reported for diverticulitis with acute perforated diverticulitis with small extraluminal air-fluid collections most compatible with developing abscess collection.  Patient is nontoxic.  Sepsis antibiotics and fluids have been initiated.  Patient got fairly good pain control on morphine 4 mg IV.  I have ordered for morphine 4 mg every 2 hours as needed and Zofran 4 mg every as needed.  General surgery has been consulted by Ladon Applebaum.   Charlesetta Shanks, MD 10/03/21 1321

## 2021-10-03 NOTE — Progress Notes (Signed)
[facility] following for sepsis protocol. 

## 2021-10-03 NOTE — ED Provider Notes (Signed)
[facility] DEPT Provider Note   CSN: [id number] Arrival date & time: 10/03/21  1000     History  Chief Complaint  Patient presents with   Abdominal Pain    Haley Rodriguez is a 51 y.o. female. With past medical history of hypertension, GERD who presents to the emergency department for abdominal pain.  States she is having abdominal pain. States it began Wednesday night. She describes it as constant pain all over. She particularly has pain on the left side. Has had associated decreased appetite and complains of associated constipation. Has has nausea with two episodes of vomiting. She denies objective fever, but has had chills since yesterday. She denies urinary symptoms, vaginal discharge, diarrhea, hematochezia or melena, hematemesis, chest pain or shortness of breath. No previous abdominal surgeries.   On chart review, seen by PCP today and noted to have temp >101 with tachycardia and sent to ED for SIRS.    Abdominal Pain Associated symptoms: chills, constipation, fever, nausea and vomiting   Associated symptoms: no dysuria        Home Medications Prior to Admission medications   Medication Sig Start Date End Date Taking? Authorizing Provider  BD PEN NEEDLE NANO 2ND GEN 32G X 4 MM MISC USE 1 NEEDLE FOR INJECTION DAILY 06/06/20   [provider]  chlorthalidone (HYGROTON) 25 MG tablet TAKE 1 TABLET BY MOUTH EVERY DAY 03/22/19   Billie Ruddy, MD  etonogestrel (NEXPLANON) 67 MG IMPL implant  11/18/18   [provider]  Liraglutide -Weight Management (SAXENDA [place]) Inject into the skin.    [provider]  pantoprazole (PROTONIX) 40 MG tablet Take 40 mg by mouth daily. Patient not taking: Reported on 10/03/2021    [provider]  potassium chloride SA (KLOR-CON M) 20 MEQ tablet Take 1 tablet (20 mEq total) by mouth daily. Patient not taking: Reported on 10/03/2021 02/19/21   Billie Ruddy, MD       Allergies    Patient has no known allergies.    Review of Systems   Review of Systems  Constitutional:  Positive for appetite change, chills and fever.  Gastrointestinal:  Positive for abdominal pain, constipation, nausea and vomiting.  Genitourinary:  Negative for dysuria.    Physical Exam Updated Vital Signs BP (!) 141/84 (BP Location: Left Arm)   Pulse (!) 117   Temp (!) 100.9 F (38.3 C) (Oral)   Resp 14   Ht '5\' 7"'$  (1.702 m)   Wt 86.2 kg   SpO2 98%   BMI 29.76 kg/m  Physical Exam Vitals and nursing note reviewed.  Constitutional:      General: She is in acute distress.     Appearance: Normal appearance. She is ill-appearing.  HENT:     Head: Normocephalic and atraumatic.     Mouth/Throat:     Mouth: Mucous membranes are moist.     Pharynx: Oropharynx is clear.  Eyes:     General: No scleral icterus.    Extraocular Movements: Extraocular movements intact.     Pupils: Pupils are equal, round, and reactive to light.  Cardiovascular:     Rate and Rhythm: Regular rhythm. Tachycardia present.     Heart sounds: Normal heart sounds. No murmur heard. Pulmonary:     Effort: Pulmonary effort is normal. No respiratory distress.     Breath sounds: Normal breath sounds.  Abdominal:     General: Abdomen is protuberant. Bowel sounds are decreased. There is no distension.  Palpations: Abdomen is soft.     Tenderness: There is generalized abdominal tenderness and tenderness in the left lower quadrant. There is guarding.  Musculoskeletal:        General: Normal range of motion.     Cervical back: Neck supple.  Skin:    General: Skin is warm and dry.     Capillary Refill: Capillary refill takes less than 2 seconds.     Coloration: Skin is not jaundiced.  Neurological:     General: No focal deficit present.     Mental Status: She is alert and oriented to person, place, and time. Mental status is at baseline.  Psychiatric:        Mood and Affect: Mood normal.         Behavior: Behavior normal.        Thought Content: Thought content normal.        Judgment: Judgment normal.    ED Results / Procedures / Treatments   Labs (all labs ordered are listed, but only abnormal results are displayed) Labs Reviewed  COMPREHENSIVE METABOLIC PANEL - Abnormal; Notable for the following components:      Result Value   Sodium 134 (*)    Potassium 3.0 (*)    Glucose, Bld 141 (*)    Total Protein 8.4 (*)    AST 14 (*)    All other components within normal limits  CBC - Abnormal; Notable for the following components:   WBC 22.3 (*)    All other components within normal limits  URINALYSIS, ROUTINE W REFLEX MICROSCOPIC - Abnormal; Notable for the following components:   APPearance HAZY (*)    Specific Gravity, Urine 1.041 (*)    Hgb urine dipstick SMALL (*)    Ketones, ur 5 (*)    Protein, ur 30 (*)    Bacteria, UA RARE (*)    Non Squamous Epithelial 0-5 (*)    All other components within normal limits  DIFFERENTIAL - Abnormal; Notable for the following components:   Neutro Abs 19.0 (*)    Monocytes Absolute 1.4 (*)    Abs Immature Granulocytes 0.15 (*)    All other components within normal limits  PROTIME-INR - Abnormal; Notable for the following components:   Prothrombin Time 15.4 (*)    All other components within normal limits  CULTURE, BLOOD (ROUTINE X 2)  CULTURE, BLOOD (ROUTINE X 2)  URINE CULTURE  RESP PANEL BY RT-PCR (FLU A&B, COVID) ARPGX2  LIPASE, BLOOD  LACTIC ACID, PLASMA  LACTIC ACID, PLASMA  APTT  I-STAT BETA HCG BLOOD, ED ([facility], [facility], [facility] ONLY)   EKG None  Radiology CT Abdomen Pelvis W Contrast  Result Date: 10/03/2021 CLINICAL DATA:  Lower abdominal pain.  Sepsis. EXAM: CT ABDOMEN AND PELVIS WITH CONTRAST TECHNIQUE: Multidetector CT imaging of the abdomen and pelvis was performed using the standard protocol following bolus administration of intravenous contrast. RADIATION DOSE REDUCTION: This exam was performed according to the  departmental dose-optimization program which includes automated exposure control, adjustment of the mA and/or kV according to patient size and/or use of iterative reconstruction technique. CONTRAST:  123m OMNIPAQUE IOHEXOL 300 MG/ML  SOLN COMPARISON:  None Available. FINDINGS: Lower chest: Lung bases are clear with mild basilar atelectasis. No significant pleural fluid. Hepatobiliary: Normal appearance of the liver, gallbladder and portal venous system. Pancreas: Unremarkable. No pancreatic ductal dilatation or surrounding inflammatory changes. Spleen: Normal in size without focal abnormality. Adrenals/Urinary Tract: Normal adrenal glands. Normal appearance of both kidneys without hydronephrosis.  No stones or suspicious renal lesions. Normal urinary bladder. Stomach/Bowel: Normal appearance of the stomach and duodenum. Inflammation centered around the descending colon and proximal sigmoid colon. There is wall thickening and pericolonic stranding around the left colon. In addition, there is a small extraluminal air-fluid collection in the left paracolic gutter that measures 2.5 x 1.5 x 2.9 cm. This air-fluid collection is poorly defined and probably represents a developing abscess collection. Small amount of free air along the left paracolic gutter on sequence 2 image 33. Evidence for multiple colonic diverticula in the distal descending colon and proximal sigmoid colon region. Inflammatory changes around the colon probably represent a perforated diverticulitis. No small bowel dilatation. Vascular/Lymphatic: No significant vascular findings are present. No enlarged abdominal or pelvic lymph nodes. Reproductive: Uterus and bilateral adnexa are unremarkable. Other: Tiny foci of gas just underneath the right hemidiaphragm compatible with free air. Inflammatory changes along the left side of the abdomen around the descending colon and left paracolic gutter. There may be trace fluid in the pelvis. Small umbilical hernia  containing fat. Musculoskeletal: No acute bone abnormality. IMPRESSION: 1. Inflammatory changes centered around the descending colon and proximal sigmoid colon. Findings are most compatible with acute perforated diverticulitis with a small extraluminal air-fluid collection in the left paracolic gutter. This is most compatible with a developing abscess collection. Small amount of free air as described. These results were called by telephone at the time of interpretation on 10/03/2021 at 12:59 pm to provider Theodis Blaze , who verbally acknowledged these results. Electronically Signed   By: Markus Daft M.D.   On: 10/03/2021 13:00   DG Chest Port 1 View  Result Date: 10/03/2021 CLINICAL DATA:  Sepsis, abdominal pain/nausea vomiting EXAM: PORTABLE CHEST 1 VIEW COMPARISON:  None FINDINGS: The cardiomediastinal silhouette is within normal limits. No focal airspace consolidation. Right costophrenic angle blunting. No pneumothorax. No acute osseous abnormality. IMPRESSION: Right costophrenic angle blunting suggesting trace effusion or pleural thickening. No focal airspace consolidation. Electronically Signed   By: Maurine Simmering M.D.   On: 10/03/2021 10:35    Procedures .Critical Care  Performed by: Mickie Hillier, PA-C Authorized by: Mickie Hillier, PA-C   Critical care provider statement:    Critical care time (minutes):  40   Critical care time was exclusive of:  Separately billable procedures and treating other patients   Critical care was necessary to treat or prevent imminent or life-threatening deterioration of the following conditions:  Sepsis   Critical care was time spent personally by me on the following activities:  Development of treatment plan with patient or surrogate, discussions with consultants, discussions with primary provider, evaluation of patient's response to treatment, examination of patient, interpretation of cardiac output measurements, obtaining history from patient or surrogate,  review of old charts, re-evaluation of patient's condition, pulse oximetry, ordering and review of radiographic studies, ordering and review of laboratory studies and ordering and performing treatments and interventions   I assumed direction of critical care for this patient from another provider in my specialty: no     Care discussed with: admitting provider      Medications Ordered in ED Medications  ondansetron (ZOFRAN) injection 4 mg (has no administration in time range)  morphine (PF) 4 MG/ML injection 4 mg (has no administration in time range)  lactated ringers infusion (has no administration in time range)  lactated ringers bolus 1,000 mL (has no administration in time range)    And  lactated ringers bolus 1,000 mL (has no  administration in time range)    And  lactated ringers bolus 1,000 mL (has no administration in time range)  cefTRIAXone (ROCEPHIN) 2 g in sodium chloride 0.9 % 100 mL IVPB (has no administration in time range)  metroNIDAZOLE (FLAGYL) IVPB 500 mg (has no administration in time range)  acetaminophen (TYLENOL) tablet 1,000 mg (1,000 mg Oral Given 10/03/21 1057)    ED Course/ Medical Decision Making/ A&P                           Medical Decision Making Amount and/or Complexity of Data Reviewed Labs: ordered. Radiology: ordered. ECG/medicine tests: ordered.  Risk OTC drugs. Prescription drug management. Decision regarding hospitalization.  This patient presents to the ED with chief complaint(s) of abdominal pain with pertinent past medical history of hypertension, GERD which further complicates the presenting complaint. The complaint involves an extensive differential diagnosis and treatment options and also carries with it a high risk of complications and morbidity.    The differential diagnosis includes Acute hepatobiliary disease, pancreatitis, appendicitis, PUD, gastritis, SBO, diverticulitis, colitis, viral gastroenteritis, Crohn's, UC, vascular  catastrophe, UTI, pyelonephritis, renal stone, obstructed stone, infected stone, ovarian torsion, ectopic pregnancy, TOA, PID, STD, etc.    Additional history obtained: Additional history obtained from family Records reviewed Primary Care Documents  ED Course: 51 year old female who presents to the emergency department from primary care for abdominal pain and fever. Physical exam with notable tenderness to the left lower quadrant on palpation.  She is febrile, tachycardic.  Not hypotensive and she is mentating well. Sepsis workup was initiated on patient arrival to triage.  Lab Tests: I Ordered, and personally interpreted labs.  The pertinent results include:  CBC with leukocytosis to 23 CMP with potassium of 3 Imaging Studies: I ordered and independently visualized and interpreted the following imaging CT scan abdomen and pelvis with contrast and X-ray chest   which showed XR with with left costophrenic angle blunting. CT abdomen pelvis shows perforated diverticulitis with microabscess  The interpretation of the imaging was limited to assessing for emergent pathology, for which purpose it was ordered. Cardiac Monitoring: The patient was maintained on a cardiac monitor.  I personally viewed and interpreted the cardiac monitor which showed an underlying rhythm of:  sinus tachycardia Medicines ordered and prescription drug management: I ordered the following medications LR, flagyl, rocephin, morphine, tylenol, zofran for fever, sepsis, pain control  I considered this additional medications: Zosyn, more pain medication  Reevaluation of the patient after these medicines showed that the patient    improved  Reassessment and review: Work-up as noted above.  On my reassessment the patient is feeling much improved after 4 mg of morphine and Zofran.  She is receiving antibiotics and fluid resuscitation.  CT of her abdomen pelvis shows that she has a perforated diverticulitis with microabscess.  I  consulted and spoke with general surgery who recommends conservative management nonoperative.  They are going to place orders for IV Zosyn and recommend hospitalist admission. Spoke with Tennis Must, hospitalist, who agrees to admit the patient at this time.  She has also agreed with the plan.  Her constellation of symptoms after work-up is inconsistent for other etiologies of abdominal pain such as acute pancreatitis, appendicitis, acute hepatobiliary pathology, PUD, gastritis, gastroenteritis, bowel obstruction, viscus perforation, vascular catastrophe, UTI, stone, obstructed stone, infected stone, PID, TOA, ectopic pregnancy.  Additional tests and treatment considered:  Consultations Obtained: I requested consultation with the admitting physician Shanon Brow  Olevia Bowens and consultant Barkley Boards, PA-C with general surgery , and discussed  findings as well as pertinent plan - they recommend: surgery recommends Zosyn (which they will order), conservative non-operative management and hospitalist admission  Complexity of problems addressed: Patient's presentation is most consistent with  acute presentation with potential threat to life or bodily function, acute illness/injury with systemic symptoms, and acute complicated illness/injury requiring diagnostic workup During patient's assessment  Disposition: After consideration of the diagnostic results and the patient's response to treatment,  I feel that the patent would benefit from admission   .  Social Determinants of Health: Patient's  none identified   increases the complexity of managing their presentation  Final Clinical Impression(s) / ED Diagnoses Final diagnoses:  Diverticulitis of colon with perforation    Rx / DC Orders ED Discharge Orders     None         Mickie Hillier, PA-C 10/03/21 1406    Charlesetta Shanks, MD 10/10/21 1542

## 2021-10-03 NOTE — ED Provider Triage Note (Signed)
Emergency Medicine Provider Triage Evaluation Note  Haley Rodriguez , a 51 y.o. female  was evaluated in triage.  Pt complains of lower abdominal pain since yesterday. Decreased appetite and complaint of constipation. She has had two episodes of nausea and vomiting as well as chills. Sent by PCP for SIRS.Marland Kitchen  Review of Systems  Positive:  Negative:   Physical Exam  BP (!) 141/84 (BP Location: Left Arm)   Pulse (!) 117   Temp (!) 100.9 F (38.3 C) (Oral)   Resp 14   Ht '5\' 7"'$  (1.702 m)   Wt 86.2 kg   SpO2 98%   BMI 29.76 kg/m  Gen:   Awake, no distress   Resp:  Normal effort  MSK:   Moves extremities without difficulty  Other:  Abdomen rounded, TTP of the LLQ primarily. Decreased BS.   Medical Decision Making  Medically screening exam initiated at 10:20 AM.  Appropriate orders placed.  Hillis Range was informed that the remainder of the evaluation will be completed by another provider, this initial triage assessment does not replace that evaluation, and the importance of remaining in the ED until their evaluation is complete.  Room sooner - febrile and tachycardic with abdominal pain    Mickie Hillier, PA-C 10/03/21 1021

## 2021-10-03 NOTE — Plan of Care (Signed)

## 2021-10-03 NOTE — Progress Notes (Signed)
Subjective:    Patient ID: Haley Rodriguez, female    DOB: Aug 03, 1970, 51 y.o.   MRN: [id number]  Chief Complaint  Patient presents with   Abdominal Pain    Started Wednesday night, all over abdomen pain, no BM in a while. When used the bathroom, it is more like liquid. Took chlorthalidone and etodolac took pain away to go to work. Has been having chills/ hot flashes, feels weak, threw up last night -yellow, felt better after   Pt accompanied by her daughter.  HPI Patient was seen today for acute concern, advised to go to ED yesterday however was unable to take off work and presents today..  Patient endorses constant abdominal pain, greater in lower abd and emesis x 2 days.  Abdominal pain started greater than 10/10.  Today pain around 6/10.  Endorses constipation for unspecified amount of time.  Last BM was hard, had to strain, caused hemorrhoids.  Denies denies hematochezia, BRBPR, fever, chills.  Trying to increase fluid intake.  Prior to episode patient states she was not drinking much water.  No prior history of diverticulosis.  Patient has appendix.  Past Medical History:  Diagnosis Date   GERD (gastroesophageal reflux disease)    Heart murmur    Hypertension     Not on File  ROS General: Denies fever, chills, night sweats, changes in weight, changes in appetite HEENT: Denies headaches, ear pain, changes in vision, rhinorrhea, sore throat CV: Denies CP, palpitations, SOB, orthopnea Pulm: Denies SOB, cough, wheezing GI: Denies diarrhea+ abdominal pain, nausea, vomiting, constipation GU: Denies dysuria, hematuria, frequency, vaginal discharge Msk: Denies muscle cramps, joint pains Neuro: Denies weakness, numbness, tingling Skin: Denies rashes, bruising Psych: Denies depression, anxiety, hallucinations      Objective:    Blood pressure (!) 152/92, pulse (!) 122, temperature (!) 101.1 F (38.4 C), temperature source Oral, weight 190 lb 9.6 oz (86.5 kg), SpO2 99 %.  Gen.  Pleasant, well-nourished, appears uncomfortable laying on exam table HEENT: [place]/AT, face symmetric, conjunctiva clear, no scleral icterus, PERRLA, EOMI, nares patent without drainage Lungs: Unable to take in a deep breath 2/2 abdominal pain.  No accessory muscle use, CTAB, no wheezes or rales Cardiovascular: Tachycardia, no m/r/g, no peripheral edema Abdomen: BS decreased, soft, abdomen distended, diffuse TTP greater in LLQ  and lower abdomen, no hepatosplenomegaly. Musculoskeletal: No deformities, no cyanosis or clubbing, normal tone Neuro:  A&Ox3, CN II-XII intact, gait not assessed Skin:  Warm, no lesions/ rash   Wt Readings from Last 3 Encounters:  02/14/21 185 lb 3.2 oz (84 kg)  08/23/20 193 lb 6.4 oz (87.7 kg)  12/08/18 204 lb (92.5 kg)    Lab Results  Component Value Date   WBC 11.4 (H) 02/14/2021   HGB 12.9 02/14/2021   HCT 38.6 02/14/2021   PLT 291.0 02/14/2021   GLUCOSE 121 (H) 02/14/2021   CHOL 168 08/23/2020   TRIG 59.0 08/23/2020   HDL 50.20 08/23/2020   LDLCALC 106 (H) 08/23/2020   ALT 11 02/14/2021   AST 16 02/14/2021   NA 136 02/14/2021   K 3.2 (L) 02/14/2021   CL 96 02/14/2021   CREATININE 0.75 02/14/2021   BUN 10 02/14/2021   CO2 30 02/14/2021   TSH 0.24 (L) 02/14/2021   HGBA1C 6.2 08/23/2020    Assessment/Plan:  Systemic inflammatory response syndrome (SIRS) (HCC)  Chronic constipation  Nausea and vomiting, unspecified vomiting type  Hemorrhoids, unspecified hemorrhoid type  Essential hypertension  Patient meets SIRS criteria as tachycardic,  elevated temp.  Concern for sepsis possibly 2/2 diverticulitis caused by chronic constipation.  Also consider UTI, ovarian torsion, etc.  Given current symptoms patient advised to proceed to emergency department for further workup including labs, imaging, IVFs, and likely antibiotics.  Patient declines EMS.  Wishes to proceed to ED via private vehicle.  Advised to go directly to ED.   Grier Mitts, MD

## 2021-10-03 NOTE — Consult Note (Signed)
Haley Rodriguez 17-Nov-1970  [id number].    Requesting MD: Dr. Tennis Must Chief Complaint/Reason for Consult: diverticulitis with small perforation  HPI:  This is a pleasant 51 yo female with a history of HTN and GERD who was working 3rd shift on Wednesday night and had an acute onset of LLQ abdominal pain.  It was acutely sharp in nature.  She had 2 episodes of a small amount of emesis yesterday, but denies nausea.  She denies diarrhea or blood in her stool.  She has not had a colonoscopy, but is currently in contact with [facility] to get this set up and arranged.  Her father as well as her sister both have had diverticulitis with colostomies in the past.  She denies any family history of colon cancer.  She states she has been AF at home and didn't know she had a fever until she was at her PCP's office this am and noted to have a temp of 101 and some tachycardia in the 120s.  She was sent to the [facility] for evaluation.  She has a WBC of 22K.  She was initially tachycardic but has responded well to IVFs.  Her CT scan reveals diverticulitis with around the descending and proximal sigmoid colon with a small amount of extraluminal air and air-fluid collection in the left paracolic gutter.  We have been asked to see her for further evaluation.  ROS: ROSsee HPI  Family History  Problem Relation Age of Onset   Heart failure Father     Past Medical History:  Diagnosis Date   GERD (gastroesophageal reflux disease)    Heart murmur    Hypertension     History reviewed. No pertinent surgical history.  Social History:  reports that she has never smoked. She has never used smokeless tobacco. She reports current alcohol use. She reports that she does not use drugs.  Allergies: No Known Allergies  (Not in a hospital admission)    Physical Exam: Blood pressure (!) 158/85, pulse 96, temperature 98.5 F (36.9 C), temperature source Oral, resp. rate 19, height '5\' 7"'$  (1.702 m), weight 86.2  kg, SpO2 97 %. General: pleasant, WD, WN black female who is laying in bed in NAD HEENT: head is normocephalic, atraumatic.  Sclera are noninjected.  PERRL.  Ears and nose without any masses or lesions.  Mouth is pink and moist Heart: regular, rate, and rhythm.  Normal s1,s2. No obvious murmurs, gallops, or rubs noted.  Palpable radial and pedal pulses bilaterally Lungs: CTAB, no wheezes, rhonchi, or rales noted.  Respiratory effort nonlabored Abd: soft, minimally tender in LLQ (just got '4mg'$  of morphine and feels significantly better), ND, +BS, no masses, hernias, or organomegaly MS: all 4 extremities are symmetrical with no cyanosis, clubbing, or edema. Skin: warm and dry with no masses, lesions, or rashes Neuro: Cranial nerves 2-12 grossly intact, sensation is normal throughout Psych: A&Ox3 with an appropriate affect.   Results for orders placed or performed during the hospital encounter of 10/03/21 (from the past 48 hour(s))  Lipase, blood     Status: None   Collection Time: 10/03/21 10:36 AM  Result Value Ref Rodriguez   Lipase 25 11 - 51 U/L    Comment: Performed at [facility], [place] [address], [place], [place] [address]  Comprehensive metabolic panel     Status: Abnormal   Collection Time: 10/03/21 10:36 AM  Result Value Ref Rodriguez   Sodium 134 (L) 135 - 145 mmol/L  Potassium 3.0 (L) 3.5 - 5.1 mmol/L   Chloride 101 98 - 111 mmol/L   CO2 23 22 - 32 mmol/L   Glucose, Bld 141 (H) 70 - 99 mg/dL    Comment: Glucose reference Rodriguez applies only to samples taken after fasting for at least 8 hours.   BUN 11 6 - 20 mg/dL   Creatinine, Ser 0.80 0.44 - 1.00 mg/dL   Calcium 9.7 8.9 - 10.3 mg/dL   Total Protein 8.4 (H) 6.5 - 8.1 g/dL   Albumin 4.1 3.5 - 5.0 g/dL   AST 14 (L) 15 - 41 U/L   ALT 11 0 - 44 U/L   Alkaline Phosphatase 79 38 - 126 U/L   Total Bilirubin 1.1 0.3 - 1.2 mg/dL   GFR, Estimated >60 >60 mL/min    Comment: (NOTE) Calculated using the CKD-EPI  Creatinine Equation (2021)    Anion gap 10 5 - 15    Comment: Performed at [facility], [place] [address], [place], [place] [address]  CBC     Status: Abnormal   Collection Time: 10/03/21 10:36 AM  Result Value Ref Rodriguez   WBC 22.3 (H) 4.0 - 10.5 K/uL   RBC 4.45 3.87 - 5.11 MIL/uL   Hemoglobin 13.5 12.0 - 15.0 g/dL   HCT 41.0 36.0 - 46.0 %   MCV 92.1 80.0 - 100.0 fL   MCH 30.3 26.0 - 34.0 pg   MCHC 32.9 30.0 - 36.0 g/dL   RDW 13.7 11.5 - 15.5 %   Platelets 216 150 - 400 K/uL   nRBC 0.0 0.0 - 0.2 %    Comment: Performed at [facility], [place] [address] [place], [place] [address]  Differential     Status: Abnormal   Collection Time: 10/03/21 10:36 AM  Result Value Ref Rodriguez   Neutrophils Relative % 86 %   Neutro Abs 19.0 (H) 1.7 - 7.7 K/uL   Lymphocytes Relative 7 %   Lymphs Abs 1.6 0.7 - 4.0 K/uL   Monocytes Relative 6 %   Monocytes Absolute 1.4 (H) 0.1 - 1.0 K/uL   Eosinophils Relative 0 %   Eosinophils Absolute 0.1 0.0 - 0.5 K/uL   Basophils Relative 0 %   Basophils Absolute 0.1 0.0 - 0.1 K/uL   Immature Granulocytes 1 %   Abs Immature Granulocytes 0.15 (H) 0.00 - 0.07 K/uL    Comment: Performed at [facility], [place] [address] [place], [place] [address]  Lactic acid, plasma     Status: None   Collection Time: 10/03/21 11:27 AM  Result Value Ref Rodriguez   Lactic Acid, Venous 1.8 0.5 - 1.9 mmol/L    Comment: Performed at [facility], [place] [address] [place], [place] [address]  I-Stat beta hCG blood, ED     Status: None   Collection Time: 10/03/21 11:41 AM  Result Value Ref Rodriguez   I-stat hCG, quantitative <5.0 <5 mIU/mL   Comment 3            Comment:   GEST. AGE      CONC.  (mIU/mL)   <=1 WEEK        5 - 50     2 WEEKS       50 - 500     3 WEEKS       100 - 10,000     4 WEEKS     1,000 - 30,000        FEMALE  AND NON-PREGNANT FEMALE:     LESS THAN 5 mIU/mL   Protime-INR     Status: Abnormal    Collection Time: 10/03/21 11:50 AM  Result Value Ref Rodriguez   Prothrombin Time 15.4 (H) 11.4 - 15.2 seconds   INR 1.2 0.8 - 1.2    Comment: (NOTE) INR goal varies based on device and disease states. Performed at [facility], [place] [address] [place], [place] [address]   APTT     Status: None   Collection Time: 10/03/21 11:50 AM  Result Value Ref Rodriguez   aPTT 33 24 - 36 seconds    Comment: Performed at [facility], [place] [address], [place], [place] [address]  Lactic acid, plasma     Status: None   Collection Time: 10/03/21 12:31 PM  Result Value Ref Rodriguez   Lactic Acid, Venous 1.1 0.5 - 1.9 mmol/L    Comment: Performed at [facility], [place] [address], [place], [place] [address]  Urinalysis, Routine w reflex microscopic Urine, Clean Catch     Status: Abnormal   Collection Time: 10/03/21 12:54 PM  Result Value Ref Rodriguez   Color, Urine YELLOW YELLOW   APPearance HAZY (A) CLEAR   Specific Gravity, Urine 1.041 (H) 1.005 - 1.030   pH 6.0 5.0 - 8.0   Glucose, UA NEGATIVE NEGATIVE mg/dL   Hgb urine dipstick SMALL (A) NEGATIVE   Bilirubin Urine NEGATIVE NEGATIVE   Ketones, ur 5 (A) NEGATIVE mg/dL   Protein, ur 30 (A) NEGATIVE mg/dL   Nitrite NEGATIVE NEGATIVE   Leukocytes,Ua NEGATIVE NEGATIVE   RBC / HPF 6-10 0 - 5 RBC/hpf   WBC, UA 11-20 0 - 5 WBC/hpf   Bacteria, UA RARE (A) NONE SEEN   Squamous Epithelial / LPF 0-5 0 - 5   Mucus PRESENT    Non Squamous Epithelial 0-5 (A) NONE SEEN    Comment: Performed at [facility], [place] [address], [place], [place] [address]   CT Abdomen Pelvis W Contrast  Result Date: 10/03/2021 CLINICAL DATA:  Lower abdominal pain.  Sepsis. EXAM: CT ABDOMEN AND PELVIS WITH CONTRAST TECHNIQUE: Multidetector CT imaging of the abdomen and pelvis was performed using the standard protocol following bolus administration of intravenous contrast. RADIATION DOSE REDUCTION: This exam was  performed according to the departmental dose-optimization program which includes automated exposure control, adjustment of the mA and/or kV according to patient size and/or use of iterative reconstruction technique. CONTRAST:  119m OMNIPAQUE IOHEXOL 300 MG/ML  SOLN COMPARISON:  None Available. FINDINGS: Lower chest: Lung bases are clear with mild basilar atelectasis. No significant pleural fluid. Hepatobiliary: Normal appearance of the liver, gallbladder and portal venous system. Pancreas: Unremarkable. No pancreatic ductal dilatation or surrounding inflammatory changes. Spleen: Normal in size without focal abnormality. Adrenals/Urinary Tract: Normal adrenal glands. Normal appearance of both kidneys without hydronephrosis. No stones or suspicious renal lesions. Normal urinary bladder. Stomach/Bowel: Normal appearance of the stomach and duodenum. Inflammation centered around the descending colon and proximal sigmoid colon. There is wall thickening and pericolonic stranding around the left colon. In addition, there is a small extraluminal air-fluid collection in the left paracolic gutter that measures 2.5 x 1.5 x 2.9 cm. This air-fluid collection is poorly defined and probably represents a developing abscess collection. Small amount of free air along the left paracolic gutter on sequence 2 image 33. Evidence for multiple colonic diverticula in the distal descending colon and proximal sigmoid colon region. Inflammatory changes around the  colon probably represent a perforated diverticulitis. No small bowel dilatation. Vascular/Lymphatic: No significant vascular findings are present. No enlarged abdominal or pelvic lymph nodes. Reproductive: Uterus and bilateral adnexa are unremarkable. Other: Tiny foci of gas just underneath the right hemidiaphragm compatible with free air. Inflammatory changes along the left side of the abdomen around the descending colon and left paracolic gutter. There may be trace fluid in the  pelvis. Small umbilical hernia containing fat. Musculoskeletal: No acute bone abnormality. IMPRESSION: 1. Inflammatory changes centered around the descending colon and proximal sigmoid colon. Findings are most compatible with acute perforated diverticulitis with a small extraluminal air-fluid collection in the left paracolic gutter. This is most compatible with a developing abscess collection. Small amount of free air as described. These results were called by telephone at the time of interpretation on 10/03/2021 at 12:59 pm to provider Theodis Blaze , who verbally acknowledged these results. Electronically Signed   By: Markus Daft M.D.   On: 10/03/2021 13:00   DG Chest Port 1 View  Result Date: 10/03/2021 CLINICAL DATA:  Sepsis, abdominal pain/nausea vomiting EXAM: PORTABLE CHEST 1 VIEW COMPARISON:  None FINDINGS: The cardiomediastinal silhouette is within normal limits. No focal airspace consolidation. Right costophrenic angle blunting. No pneumothorax. No acute osseous abnormality. IMPRESSION: Right costophrenic angle blunting suggesting trace effusion or pleural thickening. No focal airspace consolidation. Electronically Signed   By: Maurine Simmering M.D.   On: 10/03/2021 10:35      Assessment/Plan Diverticulitis with localized perforation, small developing abscess The patient has been seen, examined, labs, vitals, chart, and imaging personally reviewed.  She appears to have complicated diverticulitis with a small amount of extra-luminal air and small developing abscess.  Her WBC is 22K, but she has responded well initially to fluid resuscitation and pain management.  She had a relatively benign abdominal exam with no peritonitis, guarding, rebounding, etc.  She does not acutely need surgical intervention at this time.  She would benefit from a trial of conservative management with bowel rest and broad-spectrum abx therapy.  We discussed possibilities of repeat imaging, drain placement for abscess, or even  surgical intervention including colectomy/colostomy if things do not improve.  She understands.  I will also try to help expedite her GI follow up and send a referral in for her to get a colonoscopy in about 6-8 weeks after this episode of diverticulitis.  We will follow this patient with you.   FEN - NPO/IVFs VTE - Lovenox ID - zosyn  HTN GERD  I reviewed ED provider notes, hospitalist notes, last 24 h vitals and pain scores, last 48 h intake and output, last 24 h labs and trends, and last 24 h imaging results.  Henreitta Cea, [facility] 10/03/2021, 2:48 PM Please see Amion for pager number during day hours 7:00am-4:30pm or 7:00am -11:30am on weekends

## 2021-10-04 DIAGNOSIS — K5792 Diverticulitis of intestine, part unspecified, without perforation or abscess without bleeding: Secondary | ICD-10-CM | POA: Diagnosis not present

## 2021-10-04 LAB — COMPREHENSIVE METABOLIC PANEL
ALT: 10 U/L (ref 0–44)
AST: 11 U/L — ABNORMAL LOW (ref 15–41)
Albumin: 3.1 g/dL — ABNORMAL LOW (ref 3.5–5.0)
Alkaline Phosphatase: 65 U/L (ref 38–126)
Anion gap: 9 (ref 5–15)
BUN: 9 mg/dL (ref 6–20)
CO2: 26 mmol/L (ref 22–32)
Calcium: 8.6 mg/dL — ABNORMAL LOW (ref 8.9–10.3)
Chloride: 102 mmol/L (ref 98–111)
Creatinine, Ser: 0.85 mg/dL (ref 0.44–1.00)
GFR, Estimated: >60 mL/min (ref >60)
Glucose, Bld: 119 mg/dL — ABNORMAL HIGH (ref 70–99)
Potassium: 3.4 mmol/L — ABNORMAL LOW (ref 3.5–5.1)
Sodium: 137 mmol/L (ref 135–145)
Total Bilirubin: 1 mg/dL (ref 0.3–1.2)
Total Protein: 6.8 g/dL (ref 6.5–8.1)

## 2021-10-04 LAB — CBC
HCT: 34.5 % — ABNORMAL LOW (ref 36.0–46.0)
Hemoglobin: 11.3 g/dL — ABNORMAL LOW (ref 12.0–15.0)
MCH: 30.2 pg (ref 26.0–34.0)
MCHC: 32.8 g/dL (ref 30.0–36.0)
MCV: 92.2 fL (ref 80.0–100.0)
Platelets: 192 10*3/uL (ref 150–400)
RBC: 3.74 MIL/uL — ABNORMAL LOW (ref 3.87–5.11)
RDW: 13.6 % (ref 11.5–15.5)
WBC: 20.7 10*3/uL — ABNORMAL HIGH (ref 4.0–10.5)
nRBC: 0 % (ref 0.0–0.2)

## 2021-10-04 LAB — URINE CULTURE: Culture: <10000 — AB

## 2021-10-04 LAB — HIV ANTIBODY (ROUTINE TESTING W REFLEX): HIV Screen 4th Generation wRfx: NONREACTIVE

## 2021-10-04 NOTE — Progress Notes (Signed)
PROGRESS NOTE  Haley Rodriguez  DOB: 1970-03-25  PCP: Billie Ruddy, MD [phone]  DOA: 10/03/2021  LOS: 1 day  Hospital Day: 2  Brief narrative: Haley Rodriguez is a 51 y.o. female with PMH significant for HTN, GERD. Patient presented to the ED on 8/11 with complaints of abdominal pain, nausea, vomiting for the past 2 days.  Her last meal before the pain started was pizza with a salad on the evening of 8/8.  No bowel movement for 5 days.  In the ED, she had a temperature of 100.9, hemodynamically stable Labs showed WC count elevated 22.3 CT abdomen and pelvis showed acute perforated diverticulitis with a developing abscess collection on the distal descending and proximal sigmoid colon. Admitted to hospitalist service General surgery was consulted   Subjective: Patient was seen and examined this morning.  Pleasant middle-aged African-American female.  Propped up in bed.  Not in distress.  No new symptoms Chart reviewed Had episodes of low-grade fever in last 24 hours, Tmax 100.9  Assessment and plan: Acute diverticulitis with localized perforation and small developing abscess Currently on conservative management with IV antibiotics, IV fluid General surgery following. No need of surgical intervention at this time.  Based on clinical progression, may need repeat imaging, drain placement for abscess or surgical intervention if not improved. Allowed for ice chips and sips of water Empirically on IV Zosyn.  Monitor fever trend and WBC count.   Continue pain management Recent Labs  Lab 10/03/21 1036 10/03/21 1127 10/03/21 1231 10/04/21 0326  WBC 22.3*  --   --  20.7*  LATICACIDVEN  --  1.8 1.1  --    Hypokalemia/hypophosphatemia Low potassium and phosphorus level on admission. Replace, recheck. Recent Labs  Lab 10/03/21 1036 10/04/21 0326  K 3.0* 3.4*  MG 2.0  --   PHOS 2.2*  --    Essential hypertension PTA on chlorthalidone 25 mg daily.   Currently on hold. Continue to monitor blood pressure.   Goals of care   Code Status: Full Code    Mobility: Encourage ambulation  Skin assessment:     Nutritional status:  Body mass index is 29.76 kg/m.          Diet:  Diet Order             Diet NPO time specified Except for: Sips with Meds, Ice Chips  Diet effective now                   DVT prophylaxis:  enoxaparin (LOVENOX) injection 40 mg Start: 10/03/21 1500   Antimicrobials: IV Zosyn Fluid: NS at 125 mill per hour Consultants: General surgery Family Communication: Family at bedside  Status is: Inpatient  Continue in-hospital care because: Ongoing conservative management of diverticulitis Level of care: Med-Surg   Dispo: The patient is from: Home              Anticipated d/c is to: Hopefully home in 2 to 3 days              Patient currently is not medically stable to d/c.   Difficult to place patient No     Infusions:   0.9 % NaCl with KCl 20 mEq / L 125 mL/hr at 10/04/21 0710   piperacillin-tazobactam (ZOSYN)  IV 3.375 g (10/04/21 1429)    Scheduled Meds:  enoxaparin (LOVENOX) injection  40 mg Subcutaneous Daily   metoprolol tartrate  5 mg Intravenous Q12H   pantoprazole (PROTONIX) IV  40 mg Intravenous Daily    PRN meds: acetaminophen **OR** acetaminophen, morphine injection, ondansetron **OR** ondansetron (ZOFRAN) IV   Antimicrobials: Anti-infectives (From admission, onward)    Start     Dose/Rate Route Frequency Ordered Stop   10/04/21 1000  cefTRIAXone (ROCEPHIN) 1 g in sodium chloride 0.9 % 100 mL IVPB  Status:  Discontinued        1 g 200 mL/hr over 30 Minutes Intravenous Daily 10/03/21 1415 10/03/21 1418   10/04/21 1000  cefTRIAXone (ROCEPHIN) 2 g in sodium chloride 0.9 % 100 mL IVPB  Status:  Discontinued        2 g 200 mL/hr over 30 Minutes Intravenous Daily 10/03/21 1418 10/03/21 1444   10/03/21 2200  metroNIDAZOLE (FLAGYL) IVPB 500 mg  Status:  Discontinued        500  mg 100 mL/hr over 60 Minutes Intravenous Every 12 hours 10/03/21 1415 10/03/21 1444   10/03/21 1445  piperacillin-tazobactam (ZOSYN) IVPB 3.375 g        3.375 g 12.5 mL/hr over 240 Minutes Intravenous Every 8 hours 10/03/21 1444     10/03/21 1115  cefTRIAXone (ROCEPHIN) 2 g in sodium chloride 0.9 % 100 mL IVPB        2 g 200 mL/hr over 30 Minutes Intravenous  Once 10/03/21 1102 10/03/21 1228   10/03/21 1115  metroNIDAZOLE (FLAGYL) IVPB 500 mg        500 mg 100 mL/hr over 60 Minutes Intravenous  Once 10/03/21 1102 10/03/21 1357       Objective: Vitals:   10/04/21 0610 10/04/21 1359  BP: (!) 118/103 127/75  Pulse: 90 89  Resp: 17 18  Temp: 99.5 F (37.5 C) 99.6 F (37.6 C)  SpO2: 100% 98%    Intake/Output Summary (Last 24 hours) at 10/04/2021 1430 Last data filed at 10/04/2021 0600 Gross per 24 hour  Intake 1488.56 ml  Output --  Net 1488.56 ml   Filed Weights   10/03/21 1008  Weight: 86.2 kg   Weight change:  Body mass index is 29.76 kg/m.   Physical Exam: General exam: Pleasant, middle-aged African-American female.  Not in physical distress Skin: No rashes, lesions or ulcers. HEENT: Atraumatic, normocephalic, no obvious bleeding Lungs: Clear to auscultate bilaterally CVS: Regular rate and rhythm, no murmur GI/Abd soft, mild lower abdominal tenderness, bowel sound present CNS: Alert, awake, oriented x3 Psychiatry: Mood appropriate Extremities: No pedal edema, no calf tenderness  Data Review: I have personally reviewed the laboratory data and studies available.  F/u labs ordered Unresulted Labs (From admission, onward)     Start     Ordered   10/10/21 0500  Creatinine, serum  (enoxaparin (LOVENOX)    CrCl >/= 30 ml/min)  Weekly,   R     Comments: while on enoxaparin therapy    10/03/21 1415   10/05/21 8366  Basic metabolic panel  Tomorrow morning,   R        10/04/21 1430   10/05/21 0500  CBC with Differential/Platelet  Tomorrow morning,   R         10/04/21 1430            Signed, Terrilee Croak, MD [facility] 10/04/2021

## 2021-10-04 NOTE — Plan of Care (Signed)
  Problem: Education: Goal: Knowledge of General Education information will improve Description: Including pain rating scale, medication(s)/side effects and non-pharmacologic comfort measures Outcome: Progressing   Problem: Pain Managment: Goal: General experience of comfort will improve Outcome: Progressing   

## 2021-10-04 NOTE — Progress Notes (Signed)
Subjective/Chief Complaint: Patient less tender - focally in LLQ No nausea or vomiting Less pain medicine requirement No BM, but some flatus   Objective: Vital signs in last 24 hours: Temp:  [98.5 F (36.9 C)-101.1 F (38.4 C)] 99.5 F (37.5 C) (08/12 0610) Pulse Rate:  [88-122] 90 (08/12 0610) Resp:  [14-26] 17 (08/12 0610) BP: (118-159)/(63-103) 118/103 (08/12 0610) SpO2:  [96 %-100 %] 100 % (08/12 0610) Weight:  [86.2 kg-86.5 kg] 86.2 kg (08/11 1008) Last BM Date :  (PTA)  Intake/Output from previous day: 08/11 0701 - 08/12 0700 In: 4688.6 [I.V.:380.4; IV Piggyback:4308.1] Out: -  Intake/Output this shift: No intake/output data recorded.  General appearance: alert, cooperative, and no distress Abd - soft, non-distended; focally tender in LLQ laterally  Lab Results:  Recent Labs    10/03/21 1036 10/04/21 0326  WBC 22.3* 20.7*  HGB 13.5 11.3*  HCT 41.0 34.5*  PLT 216 192   BMET Recent Labs    10/03/21 1036 10/04/21 0326  NA 134* 137  K 3.0* 3.4*  CL 101 102  CO2 23 26  GLUCOSE 141* 119*  BUN 11 9  CREATININE 0.80 0.85  CALCIUM 9.7 8.6*   PT/INR Recent Labs    10/03/21 1150  LABPROT 15.4*  INR 1.2   ABG No results for input(s): "PHART", "HCO3" in the last 72 hours.  Invalid input(s): "PCO2", "PO2"  Studies/Results: CT Abdomen Pelvis W Contrast  Result Date: 10/03/2021 CLINICAL DATA:  Lower abdominal pain.  Sepsis. EXAM: CT ABDOMEN AND PELVIS WITH CONTRAST TECHNIQUE: Multidetector CT imaging of the abdomen and pelvis was performed using the standard protocol following bolus administration of intravenous contrast. RADIATION DOSE REDUCTION: This exam was performed according to the departmental dose-optimization program which includes automated exposure control, adjustment of the mA and/or kV according to patient size and/or use of iterative reconstruction technique. CONTRAST:  110m OMNIPAQUE IOHEXOL 300 MG/ML  SOLN COMPARISON:  None Available.  FINDINGS: Lower chest: Lung bases are clear with mild basilar atelectasis. No significant pleural fluid. Hepatobiliary: Normal appearance of the liver, gallbladder and portal venous system. Pancreas: Unremarkable. No pancreatic ductal dilatation or surrounding inflammatory changes. Spleen: Normal in size without focal abnormality. Adrenals/Urinary Tract: Normal adrenal glands. Normal appearance of both kidneys without hydronephrosis. No stones or suspicious renal lesions. Normal urinary bladder. Stomach/Bowel: Normal appearance of the stomach and duodenum. Inflammation centered around the descending colon and proximal sigmoid colon. There is wall thickening and pericolonic stranding around the left colon. In addition, there is a small extraluminal air-fluid collection in the left paracolic gutter that measures 2.5 x 1.5 x 2.9 cm. This air-fluid collection is poorly defined and probably represents a developing abscess collection. Small amount of free air along the left paracolic gutter on sequence 2 image 33. Evidence for multiple colonic diverticula in the distal descending colon and proximal sigmoid colon region. Inflammatory changes around the colon probably represent a perforated diverticulitis. No small bowel dilatation. Vascular/Lymphatic: No significant vascular findings are present. No enlarged abdominal or pelvic lymph nodes. Reproductive: Uterus and bilateral adnexa are unremarkable. Other: Tiny foci of gas just underneath the right hemidiaphragm compatible with free air. Inflammatory changes along the left side of the abdomen around the descending colon and left paracolic gutter. There may be trace fluid in the pelvis. Small umbilical hernia containing fat. Musculoskeletal: No acute bone abnormality. IMPRESSION: 1. Inflammatory changes centered around the descending colon and proximal sigmoid colon. Findings are most compatible with acute perforated diverticulitis with a small  extraluminal air-fluid  collection in the left paracolic gutter. This is most compatible with a developing abscess collection. Small amount of free air as described. These results were called by telephone at the time of interpretation on 10/03/2021 at 12:59 pm to provider Theodis Blaze , who verbally acknowledged these results. Electronically Signed   By: Markus Daft M.D.   On: 10/03/2021 13:00   DG Chest Port 1 View  Result Date: 10/03/2021 CLINICAL DATA:  Sepsis, abdominal pain/nausea vomiting EXAM: PORTABLE CHEST 1 VIEW COMPARISON:  None FINDINGS: The cardiomediastinal silhouette is within normal limits. No focal airspace consolidation. Right costophrenic angle blunting. No pneumothorax. No acute osseous abnormality. IMPRESSION: Right costophrenic angle blunting suggesting trace effusion or pleural thickening. No focal airspace consolidation. Electronically Signed   By: Maurine Simmering M.D.   On: 10/03/2021 10:35    Anti-infectives: Anti-infectives (From admission, onward)    Start     Dose/Rate Route Frequency Ordered Stop   10/04/21 1000  cefTRIAXone (ROCEPHIN) 1 g in sodium chloride 0.9 % 100 mL IVPB  Status:  Discontinued        1 g 200 mL/hr over 30 Minutes Intravenous Daily 10/03/21 1415 10/03/21 1418   10/04/21 1000  cefTRIAXone (ROCEPHIN) 2 g in sodium chloride 0.9 % 100 mL IVPB  Status:  Discontinued        2 g 200 mL/hr over 30 Minutes Intravenous Daily 10/03/21 1418 10/03/21 1444   10/03/21 2200  metroNIDAZOLE (FLAGYL) IVPB 500 mg  Status:  Discontinued        500 mg 100 mL/hr over 60 Minutes Intravenous Every 12 hours 10/03/21 1415 10/03/21 1444   10/03/21 1445  piperacillin-tazobactam (ZOSYN) IVPB 3.375 g        3.375 g 12.5 mL/hr over 240 Minutes Intravenous Every 8 hours 10/03/21 1444     10/03/21 1115  cefTRIAXone (ROCEPHIN) 2 g in sodium chloride 0.9 % 100 mL IVPB        2 g 200 mL/hr over 30 Minutes Intravenous  Once 10/03/21 1102 10/03/21 1228   10/03/21 1115  metroNIDAZOLE (FLAGYL) IVPB 500 mg         500 mg 100 mL/hr over 60 Minutes Intravenous  Once 10/03/21 1102 10/03/21 1357       Assessment/Plan: Diverticulitis with localized perforation, small developing abscess  WBC slightly improved Abd exam improving Continue conservative management with ice chips, antibiotics, IV fluids Encourage ambulation.   She does not acutely need surgical intervention at this time.  We discussed possibilities of repeat imaging, drain placement for abscess, or even surgical intervention including colectomy/colostomy if things do not improve.       FEN - Ice chips, sips of water/IVFs VTE - Lovenox ID - zosyn   HTN GERD  LOS: 1 day    Maia Petties 10/04/2021

## 2021-10-05 DIAGNOSIS — K5792 Diverticulitis of intestine, part unspecified, without perforation or abscess without bleeding: Secondary | ICD-10-CM | POA: Diagnosis not present

## 2021-10-05 LAB — BASIC METABOLIC PANEL
Anion gap: 9 (ref 5–15)
BUN: 10 mg/dL (ref 6–20)
CO2: 23 mmol/L (ref 22–32)
Calcium: 8.6 mg/dL — ABNORMAL LOW (ref 8.9–10.3)
Chloride: 105 mmol/L (ref 98–111)
Creatinine, Ser: 0.69 mg/dL (ref 0.44–1.00)
GFR, Estimated: >60 mL/min (ref >60)
Glucose, Bld: 86 mg/dL (ref 70–99)
Potassium: 4.1 mmol/L (ref 3.5–5.1)
Sodium: 137 mmol/L (ref 135–145)

## 2021-10-05 LAB — CBC WITH DIFFERENTIAL/PLATELET
Abs Immature Granulocytes: 0.14 10*3/uL — ABNORMAL HIGH (ref 0.00–0.07)
Basophils Absolute: 0.1 10*3/uL (ref 0.0–0.1)
Basophils Relative: 0 %
Eosinophils Absolute: 0.1 10*3/uL (ref 0.0–0.5)
Eosinophils Relative: 1 %
HCT: 33.9 % — ABNORMAL LOW (ref 36.0–46.0)
Hemoglobin: 11 g/dL — ABNORMAL LOW (ref 12.0–15.0)
Immature Granulocytes: 1 %
Lymphocytes Relative: 10 %
Lymphs Abs: 1.6 10*3/uL (ref 0.7–4.0)
MCH: 30.1 pg (ref 26.0–34.0)
MCHC: 32.4 g/dL (ref 30.0–36.0)
MCV: 92.9 fL (ref 80.0–100.0)
Monocytes Absolute: 1.3 10*3/uL — ABNORMAL HIGH (ref 0.1–1.0)
Monocytes Relative: 8 %
Neutro Abs: 13.2 10*3/uL — ABNORMAL HIGH (ref 1.7–7.7)
Neutrophils Relative %: 80 %
Platelets: 202 10*3/uL (ref 150–400)
RBC: 3.65 MIL/uL — ABNORMAL LOW (ref 3.87–5.11)
RDW: 13.6 % (ref 11.5–15.5)
WBC: 16.4 10*3/uL — ABNORMAL HIGH (ref 4.0–10.5)
nRBC: 0 % (ref 0.0–0.2)

## 2021-10-05 MED ORDER — ALUM & MAG HYDROXIDE-SIMETH 200-200-20 MG/5ML PO SUSP
30.0000 mL | Freq: Four times a day (QID) | ORAL | Status: DC | PRN
  Administered 2021-10-05 – 2021-10-06 (×4): 30 mL via ORAL
  Filled 2021-10-05 (×4): qty 30

## 2021-10-05 NOTE — Progress Notes (Signed)
The patient is requesting Maalox.  Messaged Hollace Hayward.

## 2021-10-05 NOTE — Progress Notes (Signed)
  Transition of Care [facility]) Screening Note   Patient Details  Name: Haley Rodriguez Date of Birth: Jul 16, 1970   Transition of Care [facility]) CM/SW Contact:    Lennart Pall, LCSW Phone Number: 10/05/2021, 9:49 AM    Transition of Care Department [facility]) has reviewed patient and no TOC needs have been identified at this time. We will continue to monitor patient advancement through interdisciplinary progression rounds. If new patient transition needs arise, please place a TOC consult.

## 2021-10-05 NOTE — Progress Notes (Signed)
Subjective/Chief Complaint: Minimal tenderness in LLQ  Passing more flatus    Objective: Vital signs in last 24 hours: Temp:  [98.7 F (37.1 C)-99.6 F (37.6 C)] 99.6 F (37.6 C) (08/13 0556) Pulse Rate:  [84-89] 84 (08/13 0556) Resp:  [16-18] 18 (08/13 0556) BP: (125-131)/(74-76) 131/74 (08/13 0556) SpO2:  [98 %-99 %] 99 % (08/13 0556) Last BM Date :  (PTA)  Intake/Output from previous day: 08/12 0701 - 08/13 0700 In: 3251.3 [P.O.:110; I.V.:2994.1; IV Piggyback:147.1] Out: -  Intake/Output this shift: No intake/output data recorded.  General appearance: alert, cooperative, and no distress Abd - soft, non-distended; minimal tenderness in LLQ laterally  Lab Results:  Recent Labs    10/04/21 0326 10/05/21 0344  WBC 20.7* 16.4*  HGB 11.3* 11.0*  HCT 34.5* 33.9*  PLT 192 202   BMET Recent Labs    10/04/21 0326 10/05/21 0344  NA 137 137  K 3.4* 4.1  CL 102 105  CO2 26 23  GLUCOSE 119* 86  BUN 9 10  CREATININE 0.85 0.69  CALCIUM 8.6* 8.6*   PT/INR Recent Labs    10/03/21 1150  LABPROT 15.4*  INR 1.2   ABG No results for input(s): "PHART", "HCO3" in the last 72 hours.  Invalid input(s): "PCO2", "PO2"  Studies/Results: CT Abdomen Pelvis W Contrast  Result Date: 10/03/2021 CLINICAL DATA:  Lower abdominal pain.  Sepsis. EXAM: CT ABDOMEN AND PELVIS WITH CONTRAST TECHNIQUE: Multidetector CT imaging of the abdomen and pelvis was performed using the standard protocol following bolus administration of intravenous contrast. RADIATION DOSE REDUCTION: This exam was performed according to the departmental dose-optimization program which includes automated exposure control, adjustment of the mA and/or kV according to patient size and/or use of iterative reconstruction technique. CONTRAST:  123m OMNIPAQUE IOHEXOL 300 MG/ML  SOLN COMPARISON:  None Available. FINDINGS: Lower chest: Lung bases are clear with mild basilar atelectasis. No significant pleural fluid.  Hepatobiliary: Normal appearance of the liver, gallbladder and portal venous system. Pancreas: Unremarkable. No pancreatic ductal dilatation or surrounding inflammatory changes. Spleen: Normal in size without focal abnormality. Adrenals/Urinary Tract: Normal adrenal glands. Normal appearance of both kidneys without hydronephrosis. No stones or suspicious renal lesions. Normal urinary bladder. Stomach/Bowel: Normal appearance of the stomach and duodenum. Inflammation centered around the descending colon and proximal sigmoid colon. There is wall thickening and pericolonic stranding around the left colon. In addition, there is a small extraluminal air-fluid collection in the left paracolic gutter that measures 2.5 x 1.5 x 2.9 cm. This air-fluid collection is poorly defined and probably represents a developing abscess collection. Small amount of free air along the left paracolic gutter on sequence 2 image 33. Evidence for multiple colonic diverticula in the distal descending colon and proximal sigmoid colon region. Inflammatory changes around the colon probably represent a perforated diverticulitis. No small bowel dilatation. Vascular/Lymphatic: No significant vascular findings are present. No enlarged abdominal or pelvic lymph nodes. Reproductive: Uterus and bilateral adnexa are unremarkable. Other: Tiny foci of gas just underneath the right hemidiaphragm compatible with free air. Inflammatory changes along the left side of the abdomen around the descending colon and left paracolic gutter. There may be trace fluid in the pelvis. Small umbilical hernia containing fat. Musculoskeletal: No acute bone abnormality. IMPRESSION: 1. Inflammatory changes centered around the descending colon and proximal sigmoid colon. Findings are most compatible with acute perforated diverticulitis with a small extraluminal air-fluid collection in the left paracolic gutter. This is most compatible with a developing abscess collection. Small  amount of free air as described. These results were called by telephone at the time of interpretation on 10/03/2021 at 12:59 pm to provider Theodis Blaze , who verbally acknowledged these results. Electronically Signed   By: Markus Daft M.D.   On: 10/03/2021 13:00   DG Chest Port 1 View  Result Date: 10/03/2021 CLINICAL DATA:  Sepsis, abdominal pain/nausea vomiting EXAM: PORTABLE CHEST 1 VIEW COMPARISON:  None FINDINGS: The cardiomediastinal silhouette is within normal limits. No focal airspace consolidation. Right costophrenic angle blunting. No pneumothorax. No acute osseous abnormality. IMPRESSION: Right costophrenic angle blunting suggesting trace effusion or pleural thickening. No focal airspace consolidation. Electronically Signed   By: Maurine Simmering M.D.   On: 10/03/2021 10:35    Anti-infectives: Anti-infectives (From admission, onward)    Start     Dose/Rate Route Frequency Ordered Stop   10/04/21 1000  cefTRIAXone (ROCEPHIN) 1 g in sodium chloride 0.9 % 100 mL IVPB  Status:  Discontinued        1 g 200 mL/hr over 30 Minutes Intravenous Daily 10/03/21 1415 10/03/21 1418   10/04/21 1000  cefTRIAXone (ROCEPHIN) 2 g in sodium chloride 0.9 % 100 mL IVPB  Status:  Discontinued        2 g 200 mL/hr over 30 Minutes Intravenous Daily 10/03/21 1418 10/03/21 1444   10/03/21 2200  metroNIDAZOLE (FLAGYL) IVPB 500 mg  Status:  Discontinued        500 mg 100 mL/hr over 60 Minutes Intravenous Every 12 hours 10/03/21 1415 10/03/21 1444   10/03/21 1445  piperacillin-tazobactam (ZOSYN) IVPB 3.375 g        3.375 g 12.5 mL/hr over 240 Minutes Intravenous Every 8 hours 10/03/21 1444     10/03/21 1115  cefTRIAXone (ROCEPHIN) 2 g in sodium chloride 0.9 % 100 mL IVPB        2 g 200 mL/hr over 30 Minutes Intravenous  Once 10/03/21 1102 10/03/21 1228   10/03/21 1115  metroNIDAZOLE (FLAGYL) IVPB 500 mg        500 mg 100 mL/hr over 60 Minutes Intravenous  Once 10/03/21 1102 10/03/21 1357        Assessment/Plan: Diverticulitis with localized perforation, small developing abscess   WBC slightly improved Abd exam improving Continue conservative management with antibiotics, IV fluids Encourage ambulation.    She does not acutely need surgical intervention at this time.  We discussed possibilities of repeat imaging, drain placement for abscess, or even surgical intervention including colectomy/colostomy if things do not improve.       FEN - clear liquids, advance as tolerated to soft diet VTE - Lovenox ID - zosyn - if she continues to improve, will likely switch to Augmentin tomorrow with possible discharge later tomorrow or Tuesday   HTN GERD  LOS: 2 days    Haley Rodriguez 10/05/2021

## 2021-10-05 NOTE — Plan of Care (Signed)
  Problem: Clinical Measurements: Goal: Ability to maintain clinical measurements within normal limits will improve Outcome: Progressing   Problem: Activity: Goal: Risk for activity intolerance will decrease Outcome: Progressing   Problem: Pain Managment: Goal: General experience of comfort will improve Outcome: Progressing   Problem: Safety: Goal: Ability to remain free from injury will improve Outcome: Progressing   

## 2021-10-05 NOTE — Plan of Care (Signed)
  Problem: Clinical Measurements: Goal: Ability to maintain clinical measurements within normal limits will improve Outcome: Progressing   Problem: Activity: Goal: Risk for activity intolerance will decrease Outcome: Progressing   Problem: Coping: Goal: Level of anxiety will decrease Outcome: Progressing   Problem: Pain Managment: Goal: General experience of comfort will improve Outcome: Progressing   

## 2021-10-05 NOTE — Progress Notes (Signed)
PROGRESS NOTE  Haley Rodriguez  DOB: 12/13/1970  PCP: Billie Ruddy, MD [id number]  DOA: 10/03/2021  LOS: 2 days  Hospital Day: 3  Brief narrative: Haley Rodriguez is a 51 y.o. female with PMH significant for HTN, GERD. Patient presented to the ED on 8/11 with complaints of abdominal pain, nausea, vomiting for the past 2 days.  Her last meal before the pain started was pizza with a salad on the evening of 8/8.  No bowel movement for 5 days.  In the ED, she had a temperature of 100.9, hemodynamically stable Labs showed WC count elevated 22.3 CT abdomen and pelvis showed acute perforated diverticulitis with a developing abscess collection on the distal descending and proximal sigmoid colon. Admitted to hospitalist service General surgery was consulted  Subjective: Patient was seen and examined this morning.  Propped up in bed.  Not in distress.  No new symptoms.  Clinically improving.  Noted a plan with general surgery to start on clear liquid diet and advance as tolerated.  Assessment and plan: Acute diverticulitis with localized perforation and small developing abscess Currently on conservative management with IV antibiotics, IV fluid General surgery following. No need of surgical intervention at this time.  Based on clinical progression, may need repeat imaging, drain placement for abscess or surgical intervention if not improved. Allowed for clear liquid diet this morning.  Patient tolerated well.  Wants to advance to full liquid diet for this afternoon. Empirically on IV Zosyn.  Monitor fever trend and WBC count.   Continue pain management Recent Labs  Lab 10/03/21 1036 10/03/21 1127 10/03/21 1231 10/04/21 0326 10/05/21 0344  WBC 22.3*  --   --  20.7* 16.4*  LATICACIDVEN  --  1.8 1.1  --   --     Hypokalemia/hypophosphatemia Potassium level improved with replacement. Recent Labs  Lab 10/03/21 1036 10/04/21 0326 10/05/21 0344  K 3.0* 3.4* 4.1   MG 2.0  --   --   PHOS 2.2*  --   --     Essential hypertension PTA on chlorthalidone 25 mg daily.  Currently on hold. Continue to monitor blood pressure.   Goals of care   Code Status: Full Code    Mobility: Encourage ambulation  Skin assessment:     Nutritional status:  Body mass index is 29.76 kg/m.          Diet:  Diet Order             Diet full liquid Room service appropriate? Yes; Fluid consistency: Thin  Diet effective now                   DVT prophylaxis:  enoxaparin (LOVENOX) injection 40 mg Start: 10/03/21 1500   Antimicrobials: IV Zosyn Fluid: NS 5 fluid today Consultants: General surgery Family Communication: Family not at bedside Status is: Inpatient  Continue in-hospital care because: Ongoing conservative management of diverticulitis Level of care: Med-Surg   Dispo: The patient is from: Home              Anticipated d/c is to: Hopefully home in 2 to 3 days              Patient currently is not medically stable to d/c.   Difficult to place patient No     Infusions:   piperacillin-tazobactam (ZOSYN)  IV 3.375 g (10/05/21 0728)    Scheduled Meds:  enoxaparin (LOVENOX) injection  40 mg Subcutaneous Daily   metoprolol tartrate  5  mg Intravenous Q12H   pantoprazole (PROTONIX) IV  40 mg Intravenous Daily    PRN meds: acetaminophen **OR** acetaminophen, morphine injection, ondansetron **OR** ondansetron (ZOFRAN) IV   Antimicrobials: Anti-infectives (From admission, onward)    Start     Dose/Rate Route Frequency Ordered Stop   10/04/21 1000  cefTRIAXone (ROCEPHIN) 1 g in sodium chloride 0.9 % 100 mL IVPB  Status:  Discontinued        1 g 200 mL/hr over 30 Minutes Intravenous Daily 10/03/21 1415 10/03/21 1418   10/04/21 1000  cefTRIAXone (ROCEPHIN) 2 g in sodium chloride 0.9 % 100 mL IVPB  Status:  Discontinued        2 g 200 mL/hr over 30 Minutes Intravenous Daily 10/03/21 1418 10/03/21 1444   10/03/21 2200  metroNIDAZOLE  (FLAGYL) IVPB 500 mg  Status:  Discontinued        500 mg 100 mL/hr over 60 Minutes Intravenous Every 12 hours 10/03/21 1415 10/03/21 1444   10/03/21 1445  piperacillin-tazobactam (ZOSYN) IVPB 3.375 g        3.375 g 12.5 mL/hr over 240 Minutes Intravenous Every 8 hours 10/03/21 1444     10/03/21 1115  cefTRIAXone (ROCEPHIN) 2 g in sodium chloride 0.9 % 100 mL IVPB        2 g 200 mL/hr over 30 Minutes Intravenous  Once 10/03/21 1102 10/03/21 1228   10/03/21 1115  metroNIDAZOLE (FLAGYL) IVPB 500 mg        500 mg 100 mL/hr over 60 Minutes Intravenous  Once 10/03/21 1102 10/03/21 1357       Objective: Vitals:   10/05/21 0556 10/05/21 1327  BP: 131/74 (!) 141/79  Pulse: 84 90  Resp: 18 18  Temp: 99.6 F (37.6 C) 98.2 F (36.8 C)  SpO2: 99% 100%    Intake/Output Summary (Last 24 hours) at 10/05/2021 1404 Last data filed at 10/05/2021 1328 Gross per 24 hour  Intake 2558.19 ml  Output --  Net 2558.19 ml    Filed Weights   10/03/21 1008  Weight: 86.2 kg   Weight change:  Body mass index is 29.76 kg/m.   Physical Exam: General exam: Pleasant, middle-aged African-American female.  Not in physical distress. Skin: No rashes, lesions or ulcers. HEENT: Atraumatic, normocephalic, no obvious bleeding Lungs: Clear to auscultate bilaterally CVS: Regular rate and rhythm, no murmur GI/Abd soft, mild lower abdominal tenderness, bowel sound present CNS: Alert, awake, oriented x3 Psychiatry: Mood appropriate Extremities: No pedal edema, no calf tenderness  Data Review: I have personally reviewed the laboratory data and studies available.  F/u labs ordered Unresulted Labs (From admission, onward)     Start     Ordered   10/10/21 0500  Creatinine, serum  (enoxaparin (LOVENOX)    CrCl >/= 30 ml/min)  Weekly,   R     Comments: while on enoxaparin therapy    10/03/21 1415   10/06/21 0500  CBC  Tomorrow morning,   R        10/05/21 1478            Signed, Terrilee Croak,  MD [facility] 10/05/2021

## 2021-10-06 DIAGNOSIS — K5792 Diverticulitis of intestine, part unspecified, without perforation or abscess without bleeding: Secondary | ICD-10-CM | POA: Diagnosis not present

## 2021-10-06 LAB — CBC
HCT: 35.2 % — ABNORMAL LOW (ref 36.0–46.0)
Hemoglobin: 11.4 g/dL — ABNORMAL LOW (ref 12.0–15.0)
MCH: 29.7 pg (ref 26.0–34.0)
MCHC: 32.4 g/dL (ref 30.0–36.0)
MCV: 91.7 fL (ref 80.0–100.0)
Platelets: 244 10*3/uL (ref 150–400)
RBC: 3.84 MIL/uL — ABNORMAL LOW (ref 3.87–5.11)
RDW: 13.5 % (ref 11.5–15.5)
WBC: 12.3 10*3/uL — ABNORMAL HIGH (ref 4.0–10.5)
nRBC: 0 % (ref 0.0–0.2)

## 2021-10-06 NOTE — Progress Notes (Signed)
PROGRESS NOTE  Haley Rodriguez  DOB: 1970-06-25  PCP: Billie Ruddy, MD [phone]  DOA: 10/03/2021  LOS: 3 days  Hospital Day: 4  Brief narrative: Haley Rodriguez is a 51 y.o. female with PMH significant for HTN, GERD. Patient presented to the ED on 8/11 with complaints of abdominal pain, nausea, vomiting for the past 2 days.  Her last meal before the pain started was pizza with a salad on the evening of 8/8.  No bowel movement for 5 days.  In the ED, she had a temperature of 100.9, hemodynamically stable Labs showed WC count elevated 22.3 CT abdomen and pelvis showed acute perforated diverticulitis with a developing abscess collection on the distal descending and proximal sigmoid colon. Admitted to hospitalist service General surgery was consulted  Subjective: Patient was seen and examined this morning.  Lying on bed.  Not in distress no new symptoms.  Daughter at bedside.  Patient is clinically improving.  Wanted to try soft diet today. General surgery follow-up appreciated.  Assessment and plan: Acute diverticulitis with localized perforation and small developing abscess Currently on conservative management with IV antibiotics, IV fluid General surgery following. No need of surgical intervention at this time.  Based on clinical progression, may need repeat imaging, drain placement for abscess or surgical intervention if not improved. Gradually improving.  Advanced from full liquid to soft diet today. Empirically on IV Zosyn.  Monitor fever trend and WBC count.   Continue pain management Recent Labs  Lab 10/03/21 1036 10/03/21 1127 10/03/21 1231 10/04/21 0326 10/05/21 0344 10/06/21 0321  WBC 22.3*  --   --  20.7* 16.4* 12.3*  LATICACIDVEN  --  1.8 1.1  --   --   --     Hypokalemia/hypophosphatemia Potassium level improved with replacement. Recent Labs  Lab 10/03/21 1036 10/04/21 0326 10/05/21 0344  K 3.0* 3.4* 4.1  MG 2.0  --   --   PHOS  2.2*  --   --     Essential hypertension PTA on chlorthalidone 25 mg daily.  Currently on hold. Continue to monitor blood pressure.   Goals of care   Code Status: Full Code    Mobility: Encourage ambulation  Skin assessment:     Nutritional status:  Body mass index is 29.76 kg/m.          Diet:  Diet Order             DIET SOFT Room service appropriate? Yes; Fluid consistency: Thin  Diet effective now                   DVT prophylaxis:  enoxaparin (LOVENOX) injection 40 mg Start: 10/03/21 1500   Antimicrobials: IV Zosyn Fluid: Okay to stop IV fluid Consultants: General surgery Family Communication: Family not at bedside Status is: Inpatient  Continue in-hospital care because: Ongoing conservative management of diverticulitis Level of care: Med-Surg   Dispo: The patient is from: Home              Anticipated d/c is to: Hopefully home tomorrow              Patient currently is not medically stable to d/c.   Difficult to place patient No     Infusions:   piperacillin-tazobactam (ZOSYN)  IV 3.375 g (10/06/21 1335)    Scheduled Meds:  enoxaparin (LOVENOX) injection  40 mg Subcutaneous Daily   metoprolol tartrate  5 mg Intravenous Q12H   pantoprazole (PROTONIX) IV  40 mg  Intravenous Daily    PRN meds: acetaminophen **OR** acetaminophen, alum & mag hydroxide-simeth, morphine injection, ondansetron **OR** ondansetron (ZOFRAN) IV   Antimicrobials: Anti-infectives (From admission, onward)    Start     Dose/Rate Route Frequency Ordered Stop   10/04/21 1000  cefTRIAXone (ROCEPHIN) 1 g in sodium chloride 0.9 % 100 mL IVPB  Status:  Discontinued        1 g 200 mL/hr over 30 Minutes Intravenous Daily 10/03/21 1415 10/03/21 1418   10/04/21 1000  cefTRIAXone (ROCEPHIN) 2 g in sodium chloride 0.9 % 100 mL IVPB  Status:  Discontinued        2 g 200 mL/hr over 30 Minutes Intravenous Daily 10/03/21 1418 10/03/21 1444   10/03/21 2200  metroNIDAZOLE (FLAGYL)  IVPB 500 mg  Status:  Discontinued        500 mg 100 mL/hr over 60 Minutes Intravenous Every 12 hours 10/03/21 1415 10/03/21 1444   10/03/21 1445  piperacillin-tazobactam (ZOSYN) IVPB 3.375 g        3.375 g 12.5 mL/hr over 240 Minutes Intravenous Every 8 hours 10/03/21 1444     10/03/21 1115  cefTRIAXone (ROCEPHIN) 2 g in sodium chloride 0.9 % 100 mL IVPB        2 g 200 mL/hr over 30 Minutes Intravenous  Once 10/03/21 1102 10/03/21 1228   10/03/21 1115  metroNIDAZOLE (FLAGYL) IVPB 500 mg        500 mg 100 mL/hr over 60 Minutes Intravenous  Once 10/03/21 1102 10/03/21 1357       Objective: Vitals:   10/06/21 0548 10/06/21 1344  BP: 123/72 130/78  Pulse: 83 89  Resp: 18 16  Temp: 99.3 F (37.4 C) 98.8 F (37.1 C)  SpO2: 97% 98%    Intake/Output Summary (Last 24 hours) at 10/06/2021 1352 Last data filed at 10/05/2021 1400 Gross per 24 hour  Intake 50 ml  Output --  Net 50 ml    Filed Weights   10/03/21 1008  Weight: 86.2 kg   Weight change:  Body mass index is 29.76 kg/m.   Physical Exam: General exam: Pleasant, middle-aged African-American female.  Not in physical distress. Skin: No rashes, lesions or ulcers. HEENT: Atraumatic, normocephalic, no obvious bleeding Lungs: Clear to auscultate bilaterally CVS: Regular rate and rhythm, no murmur GI/Abd soft, improved lower abdominal tenderness, bowel sound present CNS: Alert, awake, oriented x3 Psychiatry: Mood appropriate Extremities: No pedal edema, no calf tenderness  Data Review: I have personally reviewed the laboratory data and studies available.  F/u labs ordered Unresulted Labs (From admission, onward)     Start     Ordered   10/10/21 0500  Creatinine, serum  (enoxaparin (LOVENOX)    CrCl >/= 30 ml/min)  Weekly,   R     Comments: while on enoxaparin therapy    10/03/21 1415            Signed, Terrilee Croak, MD [facility] 10/06/2021

## 2021-10-06 NOTE — Plan of Care (Signed)
  Problem: Education: Goal: Knowledge of General Education information will improve Description: Including pain rating scale, medication(s)/side effects and non-pharmacologic comfort measures Outcome: Progressing   Problem: Health Behavior/Discharge Planning: Goal: Ability to manage health-related needs will improve Outcome: Progressing   Problem: Clinical Measurements: Goal: Ability to maintain clinical measurements within normal limits will improve Outcome: Progressing Goal: Will remain free from infection Outcome: Progressing Goal: Diagnostic test results will improve Outcome: Progressing Goal: Respiratory complications will improve Outcome: Progressing Goal: Cardiovascular complication will be avoided Outcome: Progressing   Problem: Activity: Goal: Risk for activity intolerance will decrease Outcome: Progressing   Problem: Nutrition: Goal: Adequate nutrition will be maintained Outcome: Progressing   Problem: Coping: Goal: Level of anxiety will decrease Outcome: Progressing   Problem: Skin Integrity: Goal: Risk for impaired skin integrity will decrease Outcome: Progressing   Problem: Safety: Goal: Ability to remain free from injury will improve Outcome: Progressing   Problem: Pain Managment: Goal: General experience of comfort will improve Outcome: Progressing   

## 2021-10-06 NOTE — Progress Notes (Signed)
The patient had a liquid bowel movement.

## 2021-10-06 NOTE — Progress Notes (Signed)
Subjective/Chief Complaint: Tolerated a soft breakfast. Afebrile. Reports some fullness/swelling of LLQ but pain improving. +flatus and loose stools.    Objective: Vital signs in last 24 hours: Temp:  [98.2 F (36.8 C)-99.3 F (37.4 C)] 99.3 F (37.4 C) (08/14 0548) Pulse Rate:  [83-90] 83 (08/14 0548) Resp:  [18] 18 (08/14 0548) BP: (123-141)/(72-79) 123/72 (08/14 0548) SpO2:  [97 %-100 %] 97 % (08/14 0548) Last BM Date : 10/05/21  Intake/Output from previous day: 08/13 0701 - 08/14 0700 In: 430 [P.O.:380; IV Piggyback:50] Out: -  Intake/Output this shift: No intake/output data recorded.  General appearance: alert, cooperative, and no distress Abd - soft, non-distended; overall nontender, subjective tenderness with palpation of left lower quadrant/flank  Lab Results:  Recent Labs    10/05/21 0344 10/06/21 0321  WBC 16.4* 12.3*  HGB 11.0* 11.4*  HCT 33.9* 35.2*  PLT 202 244   BMET Recent Labs    10/04/21 0326 10/05/21 0344  NA 137 137  K 3.4* 4.1  CL 102 105  CO2 26 23  GLUCOSE 119* 86  BUN 9 10  CREATININE 0.85 0.69  CALCIUM 8.6* 8.6*   PT/INR No results for input(s): "LABPROT", "INR" in the last 72 hours.  ABG No results for input(s): "PHART", "HCO3" in the last 72 hours.  Invalid input(s): "PCO2", "PO2"  Studies/Results: No results found.  Anti-infectives: Anti-infectives (From admission, onward)    Start     Dose/Rate Route Frequency Ordered Stop   10/04/21 1000  cefTRIAXone (ROCEPHIN) 1 g in sodium chloride 0.9 % 100 mL IVPB  Status:  Discontinued        1 g 200 mL/hr over 30 Minutes Intravenous Daily 10/03/21 1415 10/03/21 1418   10/04/21 1000  cefTRIAXone (ROCEPHIN) 2 g in sodium chloride 0.9 % 100 mL IVPB  Status:  Discontinued        2 g 200 mL/hr over 30 Minutes Intravenous Daily 10/03/21 1418 10/03/21 1444   10/03/21 2200  metroNIDAZOLE (FLAGYL) IVPB 500 mg  Status:  Discontinued        500 mg 100 mL/hr over 60 Minutes  Intravenous Every 12 hours 10/03/21 1415 10/03/21 1444   10/03/21 1445  piperacillin-tazobactam (ZOSYN) IVPB 3.375 g        3.375 g 12.5 mL/hr over 240 Minutes Intravenous Every 8 hours 10/03/21 1444     10/03/21 1115  cefTRIAXone (ROCEPHIN) 2 g in sodium chloride 0.9 % 100 mL IVPB        2 g 200 mL/hr over 30 Minutes Intravenous  Once 10/03/21 1102 10/03/21 1228   10/03/21 1115  metroNIDAZOLE (FLAGYL) IVPB 500 mg        500 mg 100 mL/hr over 60 Minutes Intravenous  Once 10/03/21 1102 10/03/21 1357       Assessment/Plan: Diverticulitis with localized perforation, small developing abscess   WBC 12 from 16 Abd exam improving Continue conservative management with antibiotics, IV fluids Encourage ambulation.    She does not acutely need surgical intervention at this time.  We discussed possibilities of repeat imaging, drain placement for abscess, or even surgical intervention including colectomy/colostomy if things do not improve.    At this point I wound continue IV abx since she does still have a leukocytosis. Continue soft diet. Check AM labs. If she remains non-tender and improving WBC then would recommend discharge home tomorrow on PO abx to complete 14 days. Will need to see GI for interval colonoscopy in 4-6 weeks.     FEN -  Soft VTE - Lovenox ID - [email or web address]    HTN GERD  LOS: 3 days    Haley Rodriguez 10/06/2021

## 2021-10-07 DIAGNOSIS — K5792 Diverticulitis of intestine, part unspecified, without perforation or abscess without bleeding: Secondary | ICD-10-CM | POA: Diagnosis not present

## 2021-10-07 LAB — CBC
HCT: 34.1 % — ABNORMAL LOW (ref 36.0–46.0)
Hemoglobin: 11.2 g/dL — ABNORMAL LOW (ref 12.0–15.0)
MCH: 30 pg (ref 26.0–34.0)
MCHC: 32.8 g/dL (ref 30.0–36.0)
MCV: 91.4 fL (ref 80.0–100.0)
Platelets: 272 10*3/uL (ref 150–400)
RBC: 3.73 MIL/uL — ABNORMAL LOW (ref 3.87–5.11)
RDW: 13.4 % (ref 11.5–15.5)
WBC: 11.5 10*3/uL — ABNORMAL HIGH (ref 4.0–10.5)
nRBC: 0 % (ref 0.0–0.2)

## 2021-10-07 MED ORDER — AMOXICILLIN-POT CLAVULANATE 875-125 MG PO TABS: 1.0000 | ORAL_TABLET | Freq: Two times a day (BID) | ORAL | 0 refills | Status: AC

## 2021-10-07 MED ORDER — SENNOSIDES-DOCUSATE SODIUM 8.6-50 MG PO TABS: 1.0000 | ORAL_TABLET | Freq: Every day | ORAL | 2 refills | Status: AC

## 2021-10-07 MED ORDER — OXYCODONE-ACETAMINOPHEN 5-325 MG PO TABS: 1.0000 | ORAL_TABLET | Freq: Four times a day (QID) | ORAL | 0 refills | Status: AC | PRN

## 2021-10-07 NOTE — Discharge Instructions (Signed)
Continue a low fiber diet at home for the next 3 days then start a high fiber diet with the goal of soft daily bowel movements. Avoid constipation

## 2021-10-07 NOTE — Progress Notes (Signed)
   Subjective/Chief Complaint: Tolerating soft diet. Afebrile. Still with some fullness/swelling of LLQ but pain continues to improve. +flatus and loose stools.   Objective: Vital signs in last 24 hours: Temp:  [98 F (36.7 C)-98.9 F (37.2 C)] 98 F (36.7 C) (08/15 0551) Pulse Rate:  [75-89] 75 (08/15 0551) Resp:  [16-17] 17 (08/15 0551) BP: (124-130)/(75-80) 129/75 (08/15 0551) SpO2:  [96 %-98 %] 96 % (08/15 0551) Last BM Date : 10/05/21  Intake/Output from previous day: No intake/output data recorded. Intake/Output this shift: No intake/output data recorded.  General appearance: alert, cooperative, and no distress Abd - soft, non-distended; overall nontender, subjective tenderness with palpation of left lower quadrant/flank  Lab Results:  Recent Labs    10/06/21 0321 10/07/21 0826  WBC 12.3* 11.5*  HGB 11.4* 11.2*  HCT 35.2* 34.1*  PLT 244 272    BMET Recent Labs    10/05/21 0344  NA 137  K 4.1  CL 105  CO2 23  GLUCOSE 86  BUN 10  CREATININE 0.69  CALCIUM 8.6*    PT/INR No results for input(s): "LABPROT", "INR" in the last 72 hours.  ABG No results for input(s): "PHART", "HCO3" in the last 72 hours.  Invalid input(s): "PCO2", "PO2"  Studies/Results: No results found.  Anti-infectives: Anti-infectives (From admission, onward)    Start     Dose/Rate Route Frequency Ordered Stop   10/04/21 1000  cefTRIAXone (ROCEPHIN) 1 g in sodium chloride 0.9 % 100 mL IVPB  Status:  Discontinued        1 g 200 mL/hr over 30 Minutes Intravenous Daily 10/03/21 1415 10/03/21 1418   10/04/21 1000  cefTRIAXone (ROCEPHIN) 2 g in sodium chloride 0.9 % 100 mL IVPB  Status:  Discontinued        2 g 200 mL/hr over 30 Minutes Intravenous Daily 10/03/21 1418 10/03/21 1444   10/03/21 2200  metroNIDAZOLE (FLAGYL) IVPB 500 mg  Status:  Discontinued        500 mg 100 mL/hr over 60 Minutes Intravenous Every 12 hours 10/03/21 1415 10/03/21 1444   10/03/21 1445   piperacillin-tazobactam (ZOSYN) IVPB 3.375 g        3.375 g 12.5 mL/hr over 240 Minutes Intravenous Every 8 hours 10/03/21 1444     10/03/21 1115  cefTRIAXone (ROCEPHIN) 2 g in sodium chloride 0.9 % 100 mL IVPB        2 g 200 mL/hr over 30 Minutes Intravenous  Once 10/03/21 1102 10/03/21 1228   10/03/21 1115  metroNIDAZOLE (FLAGYL) IVPB 500 mg        500 mg 100 mL/hr over 60 Minutes Intravenous  Once 10/03/21 1102 10/03/21 1357       Assessment/Plan: Diverticulitis with localized perforation, small developing abscess   WBC continues to improve. Pain improved and tolerating diet.    She does not acutely need surgical intervention at this time. As she remains non-tender and improving WBC stable for discharge home today from surgical perspective - recommend PO abx to complete 14 days. Will need to see GI for interval colonoscopy in 4-6 weeks. We will arrange follow up with colorectal surgery prior to discharge to occur after GI follow up  Work note provided     FEN - Soft VTE - Lovenox ID - [email or web address]    HTN GERD   LOS: 4 days    Winferd Humphrey, [facility] 10/07/2021, 9:14 AM Please see Amion for pager number during day hours 7:00am-4:30pm

## 2021-10-07 NOTE — Discharge Summary (Signed)
Physician Discharge Summary  Haley Rodriguez MEQ:[id number] DOB: 01-Apr-1970 DOA: 10/03/2021  PCP: Billie Ruddy, MD  Admit date: 10/03/2021 Discharge date: 10/07/2021  Admitted From: Home Discharge disposition: Home  Recommendations at discharge:  Complete 10 more days of antibiotics Pain meds as needed Avoid constipation  Brief narrative: Haley Rodriguez is a 51 y.o. female with PMH significant for HTN, GERD. Patient presented to the ED on 8/11 with complaints of abdominal pain, nausea, vomiting for the past 2 days.  Her last meal before the pain started was pizza with a salad on the evening of 8/8.  No bowel movement for 5 days.  In the ED, she had a temperature of 100.9, hemodynamically stable Labs showed WC count elevated 22.3 CT abdomen and pelvis showed acute perforated diverticulitis with a developing abscess collection on the distal descending and proximal sigmoid colon. Admitted to hospitalist service General surgery was consulted  Subjective: Patient was seen and examined this morning.  Lying down in bed.  Not in distress.  Abdomen pain improving.  Tolerating soft diet.  Hospital course: Acute diverticulitis with localized perforation and small developing abscess Currently on conservative management with IV antibiotics, IV fluid General surgery consult was obtained.  Patient did not require surgical intervention.  She is clinically improving on IV antibiotics.  WBC count improved.  Abdomen less tender.  Able to tolerate soft diet.  Currently on IV Zosyn.  She will complete 2 weeks course of antibiotics with oral Augmentin.  Pain medicine as needed Recent Labs  Lab 10/03/21 1036 10/03/21 1127 10/03/21 1231 10/04/21 0326 10/05/21 0344 10/06/21 0321 10/07/21 0826  WBC 22.3*  --   --  20.7* 16.4* 12.3* 11.5*  LATICACIDVEN  --  1.8 1.1  --   --   --   --    Hypokalemia/hypophosphatemia Potassium level improved with replacement. Recent Labs  Lab  10/03/21 1036 10/04/21 0326 10/05/21 0344  K 3.0* 3.4* 4.1  MG 2.0  --   --   PHOS 2.2*  --   --    Essential hypertension PTA on chlorthalidone 25 mg daily.  Resume post discharge   Wounds:  -    Discharge Exam:   Vitals:   10/06/21 0548 10/06/21 1344 10/06/21 2049 10/07/21 0551  BP: 123/72 130/78 124/80 129/75  Pulse: 83 89 82 75  Resp: '18 16 16 17  '$ Temp: 99.3 F (37.4 C) 98.8 F (37.1 C) 98.9 F (37.2 C) 98 F (36.7 C)  TempSrc: Oral Oral Oral Oral  SpO2: 97% 98% 97% 96%  Weight:      Height:        Body mass index is 29.76 kg/m.   General exam: Pleasant, middle-aged African-American female.  Not in physical distress. Skin: No rashes, lesions or ulcers. HEENT: Atraumatic, normocephalic, no obvious bleeding Lungs: Clear to auscultate bilaterally CVS: Regular rate and rhythm, no murmur GI/Abd soft, improved lower abdominal tenderness, bowel sound present CNS: Alert, awake, oriented x3 Psychiatry: Mood appropriate Extremities: No pedal edema, no calf tenderness  Follow ups:    Follow-up Information     Ileana Roup, MD Follow up on 12/09/2021.   Specialties: General Surgery, Colon and Rectal Surgery Why: follow up after colonoscopy on 12/09/2021 at 10:30 am. Please arrive 30 minutes early to complete check in process and bring photo ID and insurance card if you have them Contact information: [place] [address] [address]  [place] Follow up.   Contact information: [address] [place] [address] [phone]        Billie Ruddy, MD Follow up.   Specialty: Family Medicine Contact information: [place] [address] [phone]                 Discharge Instructions:   Discharge Instructions     Ambulatory referral to Gastroenterology   Complete by: As directed    Screening and s/p hospitalization  for complicated diverticulitis   What is the reason for referral?: Colonoscopy   Call MD for:  difficulty breathing, headache or visual disturbances   Complete by: As directed    Call MD for:  extreme fatigue   Complete by: As directed    Call MD for:  hives   Complete by: As directed    Call MD for:  persistant dizziness or light-headedness   Complete by: As directed    Call MD for:  persistant nausea and vomiting   Complete by: As directed    Call MD for:  severe uncontrolled pain   Complete by: As directed    Call MD for:  temperature >100.4   Complete by: As directed    Diet general   Complete by: As directed    Soft diet for few days, gradually advance.   Discharge instructions   Complete by: As directed    Recommendations at discharge:   Complete 10 more days of antibiotics  Pain meds as needed  Avoid constipation  General discharge instructions: Follow with Primary MD Billie Ruddy, MD in 7 days  Please request your PCP  to go over your hospital tests, procedures, radiology results at the follow up. Please get your medicines reviewed and adjusted.  Your PCP may decide to repeat certain labs or tests as needed. Do not drive, operate heavy machinery, perform activities at heights, swimming or participation in water activities or provide baby sitting services if your were admitted for syncope or siezures until you have seen by Primary MD or a Neurologist and advised to do so again. [place] Controlled Substance Reporting System database was reviewed. Do not drive, operate heavy machinery, perform activities at heights, swim, participate in water activities or provide baby-sitting services while on medications for pain, sleep and mood until your outpatient physician has reevaluated you and advised to do so again.  You are strongly recommended to comply with the dose, frequency and duration of prescribed medications. Activity: As tolerated with Full fall precautions use  walker/cane & assistance as needed Avoid using any recreational substances like cigarette, tobacco, alcohol, or non-prescribed drug. If you experience worsening of your admission symptoms, develop shortness of breath, life threatening emergency, suicidal or homicidal thoughts you must seek medical attention immediately by calling 911 or calling your MD immediately  if symptoms less severe. You must read complete instructions/literature along with all the possible adverse reactions/side effects for all the medicines you take and that have been prescribed to you. Take any new medicine only after you have completely understood and accepted all the possible adverse reactions/side effects.  Wear Seat belts while driving. You were cared for by a hospitalist during your hospital stay. If you have any questions about your discharge medications or the care you received while you were in the hospital after you are discharged, you can call the unit and ask to speak with the hospitalist or the covering physician. Once  you are discharged, your primary care physician will handle any further medical issues. Please note that NO REFILLS for any discharge medications will be authorized once you are discharged, as it is imperative that you return to your primary care physician (or establish a relationship with a primary care physician if you do not have one).   Increase activity slowly   Complete by: As directed        Discharge Medications:   Allergies as of 10/07/2021   No Known Allergies      Medication List     STOP taking these medications    hydrochlorothiazide 25 MG tablet Commonly known as: HYDRODIURIL   potassium chloride SA 20 MEQ tablet Commonly known as: KLOR-CON M       TAKE these medications    amoxicillin-clavulanate 875-125 MG tablet Commonly known as: AUGMENTIN Take 1 tablet by mouth 2 (two) times daily for 10 days.   BD Pen Needle Nano 2nd Gen 32G X 4 MM Misc Generic drug:  Insulin Pen Needle USE 1 NEEDLE FOR INJECTION DAILY   chlorthalidone 25 MG tablet Commonly known as: HYGROTON TAKE 1 TABLET BY MOUTH EVERY DAY   etodolac 400 MG tablet Commonly known as: LODINE Take 400 mg by mouth daily as needed (pain).   etonogestrel 68 MG Impl implant Commonly known as: NEXPLANON   meloxicam 7.5 MG tablet Commonly known as: MOBIC Take 7.5 mg by mouth daily.   oxyCODONE-acetaminophen 5-325 MG tablet Commonly known as: Percocet Take 1 tablet by mouth every 6 (six) hours as needed for up to 7 days for severe pain.   pantoprazole 40 MG tablet Commonly known as: PROTONIX Take 40 mg by mouth See admin instructions. Take 40 mg by mouth 1-2 times a day   SAXENDA [place] Inject 3 Units into the skin daily.   senna-docusate 8.6-50 MG tablet Commonly known as: Senokot-S Take 1 tablet by mouth at bedtime.         The results of significant diagnostics from this hospitalization (including imaging, microbiology, ancillary and laboratory) are listed below for reference.    Procedures and Diagnostic Studies:   CT Abdomen Pelvis W Contrast  Result Date: 10/03/2021 CLINICAL DATA:  Lower abdominal pain.  Sepsis. EXAM: CT ABDOMEN AND PELVIS WITH CONTRAST TECHNIQUE: Multidetector CT imaging of the abdomen and pelvis was performed using the standard protocol following bolus administration of intravenous contrast. RADIATION DOSE REDUCTION: This exam was performed according to the departmental dose-optimization program which includes automated exposure control, adjustment of the mA and/or kV according to patient size and/or use of iterative reconstruction technique. CONTRAST:  178m OMNIPAQUE IOHEXOL 300 MG/ML  SOLN COMPARISON:  None Available. FINDINGS: Lower chest: Lung bases are clear with mild basilar atelectasis. No significant pleural fluid. Hepatobiliary: Normal appearance of the liver, gallbladder and portal venous system. Pancreas: Unremarkable. No pancreatic ductal  dilatation or surrounding inflammatory changes. Spleen: Normal in size without focal abnormality. Adrenals/Urinary Tract: Normal adrenal glands. Normal appearance of both kidneys without hydronephrosis. No stones or suspicious renal lesions. Normal urinary bladder. Stomach/Bowel: Normal appearance of the stomach and duodenum. Inflammation centered around the descending colon and proximal sigmoid colon. There is wall thickening and pericolonic stranding around the left colon. In addition, there is a small extraluminal air-fluid collection in the left paracolic gutter that measures 2.5 x 1.5 x 2.9 cm. This air-fluid collection is poorly defined and probably represents a developing abscess collection. Small amount of free air along the left paracolic gutter on sequence 2  image 33. Evidence for multiple colonic diverticula in the distal descending colon and proximal sigmoid colon region. Inflammatory changes around the colon probably represent a perforated diverticulitis. No small bowel dilatation. Vascular/Lymphatic: No significant vascular findings are present. No enlarged abdominal or pelvic lymph nodes. Reproductive: Uterus and bilateral adnexa are unremarkable. Other: Tiny foci of gas just underneath the right hemidiaphragm compatible with free air. Inflammatory changes along the left side of the abdomen around the descending colon and left paracolic gutter. There may be trace fluid in the pelvis. Small umbilical hernia containing fat. Musculoskeletal: No acute bone abnormality. IMPRESSION: 1. Inflammatory changes centered around the descending colon and proximal sigmoid colon. Findings are most compatible with acute perforated diverticulitis with a small extraluminal air-fluid collection in the left paracolic gutter. This is most compatible with a developing abscess collection. Small amount of free air as described. These results were called by telephone at the time of interpretation on 10/03/2021 at 12:59 pm to  provider Theodis Blaze , who verbally acknowledged these results. Electronically Signed   By: Markus Daft M.D.   On: 10/03/2021 13:00   DG Chest Port 1 View  Result Date: 10/03/2021 CLINICAL DATA:  Sepsis, abdominal pain/nausea vomiting EXAM: PORTABLE CHEST 1 VIEW COMPARISON:  None FINDINGS: The cardiomediastinal silhouette is within normal limits. No focal airspace consolidation. Right costophrenic angle blunting. No pneumothorax. No acute osseous abnormality. IMPRESSION: Right costophrenic angle blunting suggesting trace effusion or pleural thickening. No focal airspace consolidation. Electronically Signed   By: Maurine Simmering M.D.   On: 10/03/2021 10:35     Labs:   Basic Metabolic Panel: Recent Labs  Lab 10/03/21 1036 10/04/21 0326 10/05/21 0344  NA 134* 137 137  K 3.0* 3.4* 4.1  CL 101 102 105  CO2 '23 26 23  '$ GLUCOSE 141* 119* 86  BUN '11 9 10  '$ CREATININE 0.80 0.85 0.69  CALCIUM 9.7 8.6* 8.6*  MG 2.0  --   --   PHOS 2.2*  --   --    GFR Estimated Creatinine Clearance: 91.7 mL/min (by C-G formula based on SCr of 0.69 mg/dL). Liver Function Tests: Recent Labs  Lab 10/03/21 1036 10/04/21 0326  AST 14* 11*  ALT 11 10  ALKPHOS 79 65  BILITOT 1.1 1.0  PROT 8.4* 6.8  ALBUMIN 4.1 3.1*   Recent Labs  Lab 10/03/21 1036  LIPASE 25   No results for input(s): "AMMONIA" in the last 168 hours. Coagulation profile Recent Labs  Lab 10/03/21 1150  INR 1.2    CBC: Recent Labs  Lab 10/03/21 1036 10/04/21 0326 10/05/21 0344 10/06/21 0321 10/07/21 0826  WBC 22.3* 20.7* 16.4* 12.3* 11.5*  NEUTROABS 19.0*  --  13.2*  --   --   HGB 13.5 11.3* 11.0* 11.4* 11.2*  HCT 41.0 34.5* 33.9* 35.2* 34.1*  MCV 92.1 92.2 92.9 91.7 91.4  PLT 216 192 202 244 272   Cardiac Enzymes: No results for input(s): "CKTOTAL", "CKMB", "CKMBINDEX", "TROPONINI" in the last 168 hours. BNP: Invalid input(s): "POCBNP" CBG: No results for input(s): "GLUCAP" in the last 168 hours. D-Dimer No results  for input(s): "DDIMER" in the last 72 hours. Hgb A1c No results for input(s): "HGBA1C" in the last 72 hours. Lipid Profile No results for input(s): "CHOL", "HDL", "LDLCALC", "TRIG", "CHOLHDL", "LDLDIRECT" in the last 72 hours. Thyroid function studies No results for input(s): "TSH", "T4TOTAL", "T3FREE", "THYROIDAB" in the last 72 hours.  Invalid input(s): "FREET3" Anemia work up No results for input(s): "VITAMINB12", "FOLATE", "FERRITIN", "  TIBC", "IRON", "RETICCTPCT" in the last 72 hours. Microbiology Recent Results (from the past 240 hour(s))  Culture, blood (routine x 2)     Status: None (Preliminary result)   Collection Time: 10/03/21 11:27 AM   Specimen: Right Antecubital; Blood  Result Value Ref Range Status   Specimen Description   Final    RIGHT ANTECUBITAL Performed at [facility] [address], [place], [place] [address]    Special Requests   Final    BOTTLES DRAWN AEROBIC AND ANAEROBIC Blood Culture adequate volume Performed at [facility] [address], [place], [place] [address]    Culture   Final    NO GROWTH 4 DAYS Performed at [facility], [place] [address] [place], [place] [address]    Report Status PENDING  Incomplete  Culture, blood (routine x 2)     Status: None (Preliminary result)   Collection Time: 10/03/21 11:50 AM   Specimen: BLOOD RIGHT HAND  Result Value Ref Range Status   Specimen Description   Final    BLOOD RIGHT HAND Performed at [facility] [address] [place], [place] [address]    Special Requests   Final    BOTTLES DRAWN AEROBIC AND ANAEROBIC Blood Culture adequate volume Performed at [facility] [address], [place], [place] [address]    Culture   Final    NO GROWTH 4 DAYS Performed at [facility], [place] [address], [place], [place] [address]    Report Status PENDING  Incomplete  Urine Culture     Status: Abnormal   Collection Time:  10/03/21 12:54 PM   Specimen: Urine, Clean Catch  Result Value Ref Range Status   Specimen Description   Final    URINE, CLEAN CATCH Performed at [facility], [place] [address], [place], [place] [address]    Special Requests   Final    NONE Performed at [facility], [place] [address] [place],  [address]    Culture (A)  Final    <10,000 COLONIES/mL INSIGNIFICANT GROWTH Performed at [facility] [address] [place],  [address]    Report Status 10/04/2021 FINAL  Final  Resp Panel by RT-PCR (Flu A&B, Covid) Anterior Nasal Swab     Status: None   Collection Time: 10/03/21  4:30 PM   Specimen: Anterior Nasal Swab  Result Value Ref Range Status   SARS Coronavirus 2 by RT PCR NEGATIVE NEGATIVE Final    Comment: (NOTE) SARS-CoV-2 target nucleic acids are NOT DETECTED.  The SARS-CoV-2 RNA is generally detectable in upper respiratory specimens during the acute phase of infection. The lowest concentration of SARS-CoV-2 viral copies this assay can detect is 138 copies/mL. A negative result does not preclude SARS-Cov-2 infection and should not be used as the sole basis for treatment or other patient management decisions. A negative result may occur with  improper specimen collection/handling, submission of specimen other than nasopharyngeal swab, presence of viral mutation(s) within the areas targeted by this assay, and inadequate number of viral copies(<138 copies/mL). A negative result must be combined with clinical observations, patient history, and epidemiological information. The expected result is Negative.  Fact Sheet for Patients:  EntrepreneurPulse.com.au  Fact Sheet for Healthcare Providers:  IncredibleEmployment.be  This test is no t yet approved or cleared by the [place] FDA and  has been authorized for detection and/or diagnosis of SARS-CoV-2 by FDA under an Emergency  Use  Authorization (EUA). This EUA will remain  in effect (meaning this test can be used) for the duration of the COVID-19 declaration under Section 564(b)(1) of the Act, 21 U.S.C.section 360bbb-3(b)(1), unless the authorization is terminated  or revoked sooner.       Influenza A by PCR NEGATIVE NEGATIVE Final   Influenza B by PCR NEGATIVE NEGATIVE Final    Comment: (NOTE) The Xpert Xpress SARS-CoV-2/FLU/RSV plus assay is intended as an aid in the diagnosis of influenza from Nasopharyngeal swab specimens and should not be used as a sole basis for treatment. Nasal washings and aspirates are unacceptable for Xpert Xpress SARS-CoV-2/FLU/RSV testing.  Fact Sheet for Patients: EntrepreneurPulse.com.au  Fact Sheet for Healthcare Providers: IncredibleEmployment.be  This test is not yet approved or cleared by the [place] FDA and has been authorized for detection and/or diagnosis of SARS-CoV-2 by FDA under an Emergency Use Authorization (EUA). This EUA will remain in effect (meaning this test can be used) for the duration of the COVID-19 declaration under Section 564(b)(1) of the Act, 21 U.S.C. section 360bbb-3(b)(1), unless the authorization is terminated or revoked.  Performed at [facility], [place] [address], [place], [place] [address]     Time coordinating discharge: 35 minutes  Signed: Marlowe Aschoff Anastasya Jewell  [facility] 10/07/2021, 11:37 AM

## 2021-10-07 NOTE — Progress Notes (Signed)
Patient discharged to home w/ dtr. Given all belongings, instructions. Verbalized understanding of instructions. Escorted to pov via w/c. 

## 2021-10-08 ENCOUNTER — Telehealth: Payer: Self-pay

## 2021-10-08 LAB — CULTURE, BLOOD (ROUTINE X 2)
Culture: NO GROWTH
Culture: NO GROWTH
Special Requests: ADEQUATE
Special Requests: ADEQUATE

## 2021-10-08 NOTE — Telephone Encounter (Signed)
Transition Care Management Follow-up Telephone Call Date of discharge and from where: [facility] 10-07-21 Dx:  Acute diverticulitis with localized perforation and small developing abscess How have you been since you were released from the hospital? Doing ok  Any questions or concerns? No  Items Reviewed: Did the pt receive and understand the discharge instructions provided? Yes  Medications obtained and verified? Yes  Other? No  Any new allergies since your discharge? No  Dietary orders reviewed? Yes Do you have support at home? Yes   Home Care and Equipment/Supplies: Were home health services ordered? no If so, what is the name of the agency? na  Has the agency set up a time to come to the patient's home? not applicable Were any new equipment or medical supplies ordered?  No What is the name of the medical supply agency? na Were you able to get the supplies/equipment? not applicable Do you have any questions related to the use of the equipment or supplies? No  Functional Questionnaire: (I = Independent and D = Dependent) ADLs: I  Bathing/Dressing- I  Meal Prep- I  Eating- I  Maintaining continence- I  Transferring/Ambulation- I  Managing Meds- I  Follow up appointments reviewed:  PCP Hospital f/u appt confirmed? Yes  Scheduled to see Dr Volanda Napoleon  on 10-15-21 @ [facility] f/u appt confirmed? No - pt is awaiting a call from GI to schedule fu appt  Are transportation arrangements needed? No  If their condition worsens, is the pt aware to call PCP or go to the Emergency Dept.? Yes Was the patient provided with contact information for the PCP's office or ED? Yes Was to pt encouraged to call back with questions or concerns? Yes

## 2021-10-15 ENCOUNTER — Ambulatory Visit: Payer: BC Managed Care – PPO | Admitting: Family Medicine

## 2021-10-15 VITALS — BP 142/78 | HR 92 | Temp 98.8°F | Wt 184.4 lb

## 2021-10-15 DIAGNOSIS — K5792 Diverticulitis of intestine, part unspecified, without perforation or abscess without bleeding: Secondary | ICD-10-CM

## 2021-10-15 DIAGNOSIS — Z09 Encounter for follow-up examination after completed treatment for conditions other than malignant neoplasm: Secondary | ICD-10-CM

## 2021-10-15 DIAGNOSIS — Z20822 Contact with and (suspected) exposure to covid-19: Secondary | ICD-10-CM

## 2021-10-15 LAB — CBC WITH DIFFERENTIAL/PLATELET
Basophils Absolute: 0 10*3/uL (ref 0.0–0.1)
Basophils Relative: 0.6 % (ref 0.0–3.0)
Eosinophils Absolute: 0.1 10*3/uL (ref 0.0–0.7)
Eosinophils Relative: 2 % (ref 0.0–5.0)
HCT: 38.6 % (ref 36.0–46.0)
Hemoglobin: 12.9 g/dL (ref 12.0–15.0)
Lymphocytes Relative: 27.2 % (ref 12.0–46.0)
Lymphs Abs: 2.1 10*3/uL (ref 0.7–4.0)
MCHC: 33.4 g/dL (ref 30.0–36.0)
MCV: 89.4 fl (ref 78.0–100.0)
Monocytes Absolute: 0.6 10*3/uL (ref 0.1–1.0)
Monocytes Relative: 7.8 % (ref 3.0–12.0)
Neutro Abs: 4.7 10*3/uL (ref 1.4–7.7)
Neutrophils Relative %: 62.4 % (ref 43.0–77.0)
Platelets: 445 10*3/uL — ABNORMAL HIGH (ref 150.0–400.0)
RBC: 4.32 Mil/uL (ref 3.87–5.11)
RDW: 13.8 % (ref 11.5–15.5)
WBC: 7.6 10*3/uL (ref 4.0–10.5)

## 2021-10-15 LAB — POC COVID19 BINAXNOW: SARS Coronavirus 2 Ag: NEGATIVE

## 2021-10-15 NOTE — Progress Notes (Signed)
Subjective:    Patient ID: Haley Rodriguez, female    DOB: 1970-12-15, 51 y.o.   MRN: [id number]  Chief Complaint  Patient presents with   Follow-up    ED - diverticulitis.    HPI Patient was seen today for HFU, hospitalized 8/11-8/15/2023 for diverticulitis with localized perforation and small developing abscesses as noted on CT abdomen/pelvis.  WBC is elevated at 22.3 and temp 100.9 F in ED.  Surgical intervention was needed.  Patient started on IV Zosyn and continue 2 weeks of p.o. Augmentin at DC.  Pt states she is feeling much better.  Taking abx and senokot.  States stools are soft and she does not have to strain.  Having occasional mild abdominal pain.  Has not really taken prescription for Percocet.  Denies fever, chills, nausea,.  Pt states she was not sure what to eat as the info at discharge from hospital was contradictory.  Upon looking at AVS, pt was given info on low fiber diet and high fiber diet.  Patient inquires if she will regain the weight that she lost while being sick.  Patient states over the weekend she was around approximately 31 family members for a birthday celebration for her father.  Patient states this morning her sister notified her that she had Beggs.  Patient denies current symptoms.  Past Medical History:  Diagnosis Date   GERD (gastroesophageal reflux disease)    Heart murmur    Hypertension     No Known Allergies  ROS General: Denies fever, chills, night sweats, changes in weight, changes in appetite HEENT: Denies headaches, ear pain, changes in vision, rhinorrhea, sore throat CV: Denies CP, palpitations, SOB, orthopnea Pulm: Denies SOB, cough, wheezing GI: Denies abdominal pain, nausea, vomiting, diarrhea, constipation  + mild abdominal pain GU: Denies dysuria, hematuria, frequency, vaginal discharge Msk: Denies muscle cramps, joint pains Neuro: Denies weakness, numbness, tingling Skin: Denies rashes, bruising Psych: Denies depression,  anxiety, hallucinations     Objective:    Blood pressure (!) 142/80, pulse 92, temperature 98.8 F (37.1 C), temperature source Oral, weight 184 lb 6.4 oz (83.6 kg), SpO2 98 %.  Gen. Pleasant, well-nourished, in no distress, normal affect   HEENT: [place]/AT, face symmetric, conjunctiva clear, no scleral icterus, PERRLA, EOMI, nares patent without drainage, pharynx without erythema or exudate. Neck: No JVD, no thyromegaly, no carotid bruits Lungs: no accessory muscle use, CTAB, no wheezes or rales Cardiovascular: RRR, no m/r/g, no peripheral edema Abdomen: BS present, soft, mild TTP in LUQ and LLQ Neuro:  A&Ox3, CN II-XII intact, normal gait Skin:  Warm, no lesions/ rash   Wt Readings from Last 3 Encounters:  10/15/21 184 lb 6.4 oz (83.6 kg)  10/03/21 190 lb (86.2 kg)  10/03/21 190 lb 9.6 oz (86.5 kg)    Lab Results  Component Value Date   WBC 11.5 (H) 10/07/2021   HGB 11.2 (L) 10/07/2021   HCT 34.1 (L) 10/07/2021   PLT 272 10/07/2021   GLUCOSE 86 10/05/2021   CHOL 168 08/23/2020   TRIG 59.0 08/23/2020   HDL 50.20 08/23/2020   LDLCALC 106 (H) 08/23/2020   ALT 10 10/04/2021   AST 11 (L) 10/04/2021   NA 137 10/05/2021   K 4.1 10/05/2021   CL 105 10/05/2021   CREATININE 0.69 10/05/2021   BUN 10 10/05/2021   CO2 23 10/05/2021   TSH 0.24 (L) 02/14/2021   INR 1.2 10/03/2021   HGBA1C 6.2 08/23/2020    Assessment/Plan:  Acute diverticulitis  -stable -  f/u with GI -continue bowel regimen - Plan: CMP, CBC with Differential/Platelet  Close exposure to COVID-19 virus  -COVID negative - Plan: [facility] discharge follow-up -notes, labs, etc reviewed.  F/u prn  Grier Mitts, MD

## 2021-10-16 ENCOUNTER — Telehealth: Payer: Self-pay | Admitting: Family Medicine

## 2021-10-16 LAB — COMPREHENSIVE METABOLIC PANEL
ALT: 15 U/L (ref 0–35)
AST: 28 U/L (ref 0–37)
Albumin: 4.5 g/dL (ref 3.5–5.2)
Alkaline Phosphatase: 77 U/L (ref 39–117)
BUN: 14 mg/dL (ref 6–23)
CO2: 15 mEq/L — ABNORMAL LOW (ref 19–32)
Calcium: 10.8 mg/dL — ABNORMAL HIGH (ref 8.4–10.5)
Chloride: 102 mEq/L (ref 96–112)
Creatinine, Ser: 0.86 mg/dL (ref 0.40–1.20)
GFR: 77.29 mL/min (ref >60.00)
Glucose, Bld: 59 mg/dL — ABNORMAL LOW (ref 70–99)
Potassium: 4.1 mEq/L (ref 3.5–5.1)
Sodium: 146 mEq/L — ABNORMAL HIGH (ref 135–145)
Total Bilirubin: 0.2 mg/dL (ref 0.2–1.2)
Total Protein: 9.2 g/dL — ABNORMAL HIGH (ref 6.0–8.3)

## 2021-10-16 NOTE — Telephone Encounter (Signed)
Form rec'd. Given to provider.

## 2021-10-16 NOTE — Telephone Encounter (Signed)
FMLA to be filled out--given directly to [employer].  Call [phone] upon completion.  Patient is requesting form to be filled out by 09/16/21.

## 2021-10-17 NOTE — Telephone Encounter (Signed)
Pt is aware fmla form will not be ready today

## 2021-11-02 ENCOUNTER — Encounter: Payer: Self-pay | Admitting: Family Medicine

## 2021-11-17 DIAGNOSIS — Z01419 Encounter for gynecological examination (general) (routine) without abnormal findings: Secondary | ICD-10-CM | POA: Diagnosis not present

## 2021-11-17 DIAGNOSIS — D251 Intramural leiomyoma of uterus: Secondary | ICD-10-CM | POA: Diagnosis not present

## 2021-11-17 DIAGNOSIS — Z975 Presence of (intrauterine) contraceptive device: Secondary | ICD-10-CM | POA: Diagnosis not present

## 2021-12-12 ENCOUNTER — Ambulatory Visit (INDEPENDENT_AMBULATORY_CARE_PROVIDER_SITE_OTHER): Payer: BC Managed Care – PPO | Admitting: Gastroenterology

## 2021-12-12 ENCOUNTER — Encounter: Payer: Self-pay | Admitting: Gastroenterology

## 2021-12-12 VITALS — BP 130/72 | HR 83 | Ht 67.0 in | Wt 190.0 lb

## 2021-12-12 DIAGNOSIS — K219 Gastro-esophageal reflux disease without esophagitis: Secondary | ICD-10-CM | POA: Diagnosis not present

## 2021-12-12 DIAGNOSIS — R1032 Left lower quadrant pain: Secondary | ICD-10-CM | POA: Insufficient documentation

## 2021-12-12 DIAGNOSIS — Z8719 Personal history of other diseases of the digestive system: Secondary | ICD-10-CM | POA: Diagnosis not present

## 2021-12-12 DIAGNOSIS — K5720 Diverticulitis of large intestine with perforation and abscess without bleeding: Secondary | ICD-10-CM | POA: Diagnosis not present

## 2021-12-12 MED ORDER — NA SULFATE-K SULFATE-MG SULF 17.5-3.13-1.6 GM/177ML PO SOLN: 1.0000 | ORAL | 0 refills | Status: DC

## 2021-12-12 NOTE — Progress Notes (Addendum)
[facility]   Primary Care Provider Billie Ruddy, MD [place] [id number] [phone]  Referring Provider Billie Ruddy, MD [address] [place],  [place] [address] [phone]  Patient Profile: Haley Rodriguez is a 51 y.o. female with a pmh significant for hypertension, diverticulosis (with history of complicated diverticulitis with perforation in 8/23), GERD.  The patient presents to the [facility] for an evaluation and management of problem(s) noted below:  Problem List 1. History of diverticulitis   2. Diverticulitis of colon with perforation   3. Abdominal pain, left lower quadrant   4. Gastroesophageal reflux disease, unspecified whether esophagitis present     History of Present Illness This is the patient's first visit to the outpatient [place] clinic.  The patient was admitted to the hospital in August in the setting of progressive left lower quadrant abdominal pain with associated nausea and vomiting and when she finally made her way into the hospital was found to have perforated diverticulitis that was contained.  She responded to antibiotic therapy and over the course of the next few days was able to be discharged.  Since that time and after the completion of antibiotics she has been doing well.  She has bowel movements on a near daily basis but at times if she has constipation she will take Senokot with good effect.  She had never had a previous bout of diverticulitis before.  She had been in the process of trying to schedule a colonoscopy but COVID occurred and then she had nervousness about getting scheduled after and held off on this so has never had colon cancer screening.  She has a family history that is significant for multiple family members would require a colostomy in the setting of diverticulitis flares previously.  The patient has had acid reflux for years.  She has  been on PPIs on an infrequent basis (she takes as needed rather than for periods of time).  She is not experiencing significant issues in regards to dysphagia or odynophagia.  She is not having significant abdominal pain unless outpatient of the left lower quadrant occurs.  She has not had any follow-up with surgery as of yet.  She has not had any updated imaging since her hospitalization in August.  She has never had an upper endoscopy.  GI Review of Systems Positive as above Negative for odynophagia, nausea, vomiting, early satiety, melena, hematochezia  Review of Systems General: Denies fevers/chills/weight loss unintentionally HEENT: Denies oral lesions Cardiovascular: Denies chest pain Pulmonary: Denies shortness of breath Gastroenterological: See HPI Genitourinary: Denies darkened urine  Hematological: Denies easy bruising/bleeding Endocrine: Denies temperature intolerance Dermatological: Denies jaundice Psychological: Mood is stable   Medications Current Outpatient Medications  Medication Sig Dispense Refill   BD PEN NEEDLE NANO 2ND GEN 32G X 4 MM MISC USE 1 NEEDLE FOR INJECTION DAILY     chlorthalidone (HYGROTON) 25 MG tablet TAKE 1 TABLET BY MOUTH EVERY DAY (Patient taking differently: Take 25 mg by mouth daily.) 90 tablet 1   etonogestrel (NEXPLANON) 68 MG IMPL implant      Liraglutide -Weight Management (SAXENDA [place]) Inject 3 Units into the skin daily.     meloxicam (MOBIC) 7.5 MG tablet Take 7.5 mg by mouth daily.     Na Sulfate-K Sulfate-Mg Sulf (SUPREP BOWEL PREP KIT) 17.5-3.13-1.6 GM/177ML SOLN Take 1 kit by mouth as directed. For colonoscopy prep 354 mL 0   pantoprazole (PROTONIX) 40 MG tablet Take  40 mg by mouth See admin instructions. Take 40 mg by mouth 1-2 times a day     senna-docusate (SENOKOT-S) 8.6-50 MG tablet Take 1 tablet by mouth at bedtime. 30 tablet 2   etodolac (LODINE) 400 MG tablet Take 400 mg by mouth daily as needed (pain). (Patient not taking: Reported  on 12/12/2021)     No current facility-administered medications for this visit.    Allergies No Known Allergies  Histories Past Medical History:  Diagnosis Date   GERD (gastroesophageal reflux disease)    Heart murmur    Hypertension    History reviewed. No pertinent surgical history. Social History   Socioeconomic History   Marital status: Single    Spouse name: Not on file   Number of children: Not on file   Years of education: Not on file   Highest education level: Not on file  Occupational History   Not on file  Tobacco Use   Smoking status: Never   Smokeless tobacco: Never  Vaping Use   Vaping Use: Never used  Substance and Sexual Activity   Alcohol use: Yes   Drug use: Never   Sexual activity: Yes  Other Topics Concern   Not on file  Social History Narrative   Not on file   Social Determinants of Health   Financial Resource Strain: Not on file  Food Insecurity: Not on file  Transportation Needs: Not on file  Physical Activity: Not on file  Stress: Not on file  Social Connections: Not on file  Intimate Partner Violence: Not on file   Family History  Problem Relation Age of Onset   Heart failure Father    Colon cancer Neg Hx    Stomach cancer Neg Hx    Esophageal cancer Neg Hx    Inflammatory bowel disease Neg Hx    Liver disease Neg Hx    Pancreatic cancer Neg Hx    Rectal cancer Neg Hx    I have reviewed her medical, social, and family history in detail and updated the electronic medical record as necessary.    PHYSICAL EXAMINATION  BP 130/72   Pulse 83   Ht 5' 7"  (1.702 m)   Wt 190 lb (86.2 kg)   BMI 29.76 kg/m  Wt Readings from Last 3 Encounters:  12/12/21 190 lb (86.2 kg)  10/15/21 184 lb 6.4 oz (83.6 kg)  10/03/21 190 lb (86.2 kg)  GEN: NAD, appears stated age, doesn't appear chronically ill PSYCH: Cooperative, without pressured speech EYE: Conjunctivae pink, sclerae anicteric ENT: MMM CV: RR without R/Gs  RESP: CTAB  posteriorly, without wheezing GI: NABS, soft, protuberant abdomen, mild TTP in left lower quadrant, volitional guarding upon very deep palpation but no rebound  MSK/EXT: Significant lower extremity edema SKIN: No jaundice NEURO:  Alert & Oriented x 3, no focal deficits   REVIEW OF DATA  I reviewed the following data at the time of this encounter:  GI Procedures and Studies  No previous studies to review  Laboratory Studies  Reviewed those in epic and care everywhere  Imaging Studies  August 2023 CT abdomen pelvis IMPRESSION: 1. Inflammatory changes centered around the descending colon and proximal sigmoid colon. Findings are most compatible with acute perforated diverticulitis with a small extraluminal air-fluid collection in the left paracolic gutter. This is most compatible with a developing abscess collection. Small amount of free air as described.   ASSESSMENT  Haley Rodriguez is a 51 y.o. female with a pmh significant for hypertension, diverticulosis (  with history of complicated diverticulitis with perforation in 8/23), GERD.  The patient is seen today for evaluation and management of:  1. History of diverticulitis   2. Diverticulitis of colon with perforation   3. Abdominal pain, left lower quadrant   4. Gastroesophageal reflux disease, unspecified whether esophagitis present    The patient is clinically and hemodynamically stable.  She has had a single episode of complicated diverticulitis.  We discussed the natural history of diverticulosis as well as diverticulitis.  She needs updated colonoscopy to ensure nothing else is being missed but before we perform a colonoscopy I need to perform an updated CT scan to ensure she has not had any evidence of a fluid collection or issue that may require other interventions before colonoscopy.  Due to her longstanding history of heartburn and PPI use, I recommend an upper endoscopy for Barrett's esophagus screening.  We did discuss  potential use of Pepcid on demand rather than PPI on demand but she wants to maintain her PPI at this time since it is her can be" that helps her significantly.  The risks and benefits of endoscopic evaluation were discussed with the patient; these include but are not limited to the risk of perforation, infection, bleeding, missed lesions, lack of diagnosis, severe illness requiring hospitalization, as well as anesthesia and sedation related illnesses.  The patient and/or family is agreeable to proceed.  All patient questions were answered to the best of my ability, and the patient agrees to the aforementioned plan of action with follow-up as indicated.   PLAN  Proceed with scheduling CT abdomen pelvis for follow-up of complicated diverticulitis - As long as no issues then will be okay for endoscopic evaluation Proceed with scheduling colonoscopy for screening purposes Proceed with scheduling endoscopy for screening Barrett's purposes Continue PPI as you are currently doing May continue to use laxative therapy as needed as you are doing   Orders Placed This Encounter  Procedures   CT ABDOMEN PELVIS [place]   Ambulatory referral to Gastroenterology    New Prescriptions   NA SULFATE-K SULFATE-MG SULF (SUPREP BOWEL PREP KIT) 17.5-3.13-1.6 GM/177ML SOLN    Take 1 kit by mouth as directed. For colonoscopy prep   Modified Medications   No medications on file    Planned Follow Up No follow-ups on file.   Total Time in Face-to-Face and in Coordination of Care for patient including independent/personal interpretation/review of prior testing, medical history, examination, medication adjustment, communicating results with the patient directly, and documentation within the EHR is 45 minutes.   Justice Britain, MD [facility] Advanced Endoscopy Office # [phone]

## 2021-12-12 NOTE — Patient Instructions (Addendum)
We have sent the following medications to your pharmacy for you to pick up at your convenience:  [place] have been scheduled for a CT scan of the abdomen and pelvis at [facility], 1st floor Radiology. You are scheduled on 12/22/21 at 10:30 am . You should arrive 2 hours prior at (8:30am ) to your appointment time for preparation.    Please follow the written instructions below on the day of your exam:   1) Do not eat anything 4 hours prior to procedure starting at 6:30 am   You may take any medications as prescribed with a small amount of water, if necessary. If you take any of the following medications: METFORMIN, GLUCOPHAGE, GLUCOVANCE, AVANDAMET, RIOMET, FORTAMET, [place] MET, JANUMET, GLUMETZA or METAGLIP, you MAY be asked to HOLD this medication 48 hours AFTER the exam.   The purpose of you drinking the oral contrast is to aid in the visualization of your intestinal tract. The contrast solution may cause some diarrhea. Depending on your individual set of symptoms, you may also receive an intravenous injection of x-ray contrast/dye. Plan on being at [facility] for 45 minutes or longer, depending on the type of exam you are having performed.   If you have any questions regarding your exam or if you need to reschedule, you may call [facility] at [phone] between the hours of 8:00 am and 5:00 pm, Monday-Friday.   May Continue Pantoprazole  as needed or you may considering using Pepcid if needed.    You have been scheduled for an endoscopy and colonoscopy. Please follow the written instructions given to you at your visit today. Please pick up your prep supplies at the pharmacy within the next 1-3 days. If you use inhalers (even only as needed), please bring them with you on the day of your procedure.      Thank you for choosing me and [facility].  Dr. Rush Landmark

## 2021-12-19 ENCOUNTER — Ambulatory Visit (HOSPITAL_COMMUNITY): Payer: BC Managed Care – PPO

## 2021-12-19 DIAGNOSIS — Z3046 Encounter for surveillance of implantable subdermal contraceptive: Secondary | ICD-10-CM | POA: Diagnosis not present

## 2021-12-22 ENCOUNTER — Encounter (HOSPITAL_COMMUNITY): Payer: Self-pay

## 2021-12-22 ENCOUNTER — Other Ambulatory Visit: Payer: Self-pay | Admitting: Gastroenterology

## 2021-12-22 ENCOUNTER — Ambulatory Visit (HOSPITAL_COMMUNITY)
Admission: RE | Admit: 2021-12-22 | Discharge: 2021-12-22 | Disposition: A | Discharge status: Home / Self Care | Payer: BC Managed Care – PPO | Source: Ambulatory Visit | Attending: Gastroenterology | Admitting: Gastroenterology

## 2021-12-22 ENCOUNTER — Other Ambulatory Visit (HOSPITAL_COMMUNITY): Payer: BC Managed Care – PPO

## 2021-12-22 DIAGNOSIS — K219 Gastro-esophageal reflux disease without esophagitis: Secondary | ICD-10-CM | POA: Insufficient documentation

## 2021-12-22 DIAGNOSIS — Z8719 Personal history of other diseases of the digestive system: Secondary | ICD-10-CM | POA: Diagnosis not present

## 2021-12-22 DIAGNOSIS — R109 Unspecified abdominal pain: Secondary | ICD-10-CM | POA: Diagnosis not present

## 2021-12-22 MED ORDER — IOHEXOL 9 MG/ML PO SOLN
ORAL | Status: AC
  Filled 2021-12-22: qty 1000

## 2021-12-22 MED ORDER — SODIUM CHLORIDE (PF) 0.9 % IJ SOLN
INTRAMUSCULAR | Status: AC
  Filled 2021-12-22: qty 50

## 2021-12-22 MED ORDER — IOHEXOL 9 MG/ML PO SOLN
1000.0000 mL | ORAL | Status: AC
  Administered 2021-12-22: 1000 mL via ORAL

## 2021-12-22 MED ORDER — IOHEXOL 9 MG/ML PO SOLN: 500.0000 mL | ORAL | Status: DC

## 2021-12-22 MED ORDER — IOHEXOL 300 MG/ML  SOLN
100.0000 mL | Freq: Once | INTRAMUSCULAR | Status: AC | PRN
Start: 2021-12-22 — End: 2021-12-22
  Administered 2021-12-22: 100 mL via INTRAVENOUS

## 2022-01-21 ENCOUNTER — Encounter: Payer: Self-pay | Admitting: Gastroenterology

## 2022-01-30 ENCOUNTER — Encounter: Payer: Self-pay | Admitting: Gastroenterology

## 2022-01-30 ENCOUNTER — Ambulatory Visit (AMBULATORY_SURGERY_CENTER): Payer: BC Managed Care – PPO | Admitting: Gastroenterology

## 2022-01-30 VITALS — BP 129/81 | HR 76 | Temp 97.8°F | Resp 19 | Ht 67.0 in | Wt 190.0 lb

## 2022-01-30 DIAGNOSIS — R12 Heartburn: Secondary | ICD-10-CM

## 2022-01-30 DIAGNOSIS — K2970 Gastritis, unspecified, without bleeding: Secondary | ICD-10-CM

## 2022-01-30 DIAGNOSIS — Z8719 Personal history of other diseases of the digestive system: Secondary | ICD-10-CM

## 2022-01-30 DIAGNOSIS — K641 Second degree hemorrhoids: Secondary | ICD-10-CM

## 2022-01-30 DIAGNOSIS — K222 Esophageal obstruction: Secondary | ICD-10-CM | POA: Diagnosis not present

## 2022-01-30 DIAGNOSIS — K5792 Diverticulitis of intestine, part unspecified, without perforation or abscess without bleeding: Secondary | ICD-10-CM

## 2022-01-30 DIAGNOSIS — K449 Diaphragmatic hernia without obstruction or gangrene: Secondary | ICD-10-CM

## 2022-01-30 DIAGNOSIS — K253 Acute gastric ulcer without hemorrhage or perforation: Secondary | ICD-10-CM | POA: Diagnosis not present

## 2022-01-30 DIAGNOSIS — K219 Gastro-esophageal reflux disease without esophagitis: Secondary | ICD-10-CM

## 2022-01-30 DIAGNOSIS — K319 Disease of stomach and duodenum, unspecified: Secondary | ICD-10-CM | POA: Diagnosis not present

## 2022-01-30 MED ORDER — SODIUM CHLORIDE 0.9 % IV SOLN: 500.0000 mL | Freq: Once | INTRAVENOUS | Status: AC

## 2022-01-30 NOTE — Progress Notes (Unsigned)
Pt's states no medical or surgical changes since previsit or office visit. 

## 2022-01-30 NOTE — Progress Notes (Unsigned)
To pacu, VSS. Report to [email or web address] 

## 2022-01-30 NOTE — Op Note (Signed)
[facility] Patient Name: Haley Rodriguez Procedure Date: 01/30/2022 2:09 PM MRN: [id number] Endoscopist: Justice Britain , MD, [id number] Age: 51 Referring MD:  Date of Birth: 1970/05/07 Gender: Female Account #: [id number] Procedure:                Colonoscopy Indications:              Screening for colorectal malignant neoplasm,                            Incidental - Abnormal CT of the GI tract,                            Incidental - Follow-up of diverticulitis Medicines:                Monitored Anesthesia Care Procedure:                Pre-Anesthesia Assessment:                           - Prior to the procedure, a History and Physical                            was performed, and patient medications and                            allergies were reviewed. The patient's tolerance of                            previous anesthesia was also reviewed. The risks                            and benefits of the procedure and the sedation                            options and risks were discussed with the patient.                            All questions were answered, and informed consent                            was obtained. Prior Anticoagulants: The patient has                            taken no anticoagulant or antiplatelet agents                            except for NSAID medication. ASA Grade Assessment:                            II - A patient with mild systemic disease. After                            reviewing the risks and benefits, the patient was  deemed in satisfactory condition to undergo the                            procedure.                           After obtaining informed consent, the colonoscope                            was passed under direct vision. Throughout the                            procedure, the patient's blood pressure, pulse, and                            oxygen saturations were monitored continuously.  The                            CF [id number] #[id number] was introduced through the anus                            and advanced to the 5 cm into the ileum. The                            colonoscopy was performed without difficulty. The                            patient tolerated the procedure. The quality of the                            bowel preparation was good. The terminal ileum,                            ileocecal valve, appendiceal orifice, and rectum                            were photographed. Scope In: 2:30:21 PM Scope Out: 2:42:05 PM Scope Withdrawal Time: 0 hours 8 minutes 19 seconds  Total Procedure Duration: 0 hours 11 minutes 44 seconds  Findings:                 The digital rectal exam findings include                            hemorrhoids. Pertinent negatives include no                            palpable rectal lesions.                           The terminal ileum contained multiple small                            diverticula.  The terminal ileum and ileocecal valve mucosa                            otherwise appeared normal.                           Many small-mouthed diverticula were found in the                            entire colon.                           Moderately congested mucosa was found in the                            sigmoid colon and in the descending colon. This was                            biopsied with a cold forceps for histology purposes.                           Normal mucosa was found in the entire colon                            otherwise.                           Non-bleeding non-thrombosed external and internal                            hemorrhoids were found during retroflexion, during                            perianal exam and during digital exam. The                            hemorrhoids were Grade II (internal hemorrhoids                            that prolapse but reduce  spontaneously). Complications:            No immediate complications. Estimated Blood Loss:     Estimated blood loss was minimal. Impression:               - Hemorrhoids found on digital rectal exam.                           - Ileal diverticula. Otherwise, the examined                            portion of the ileum was normal.                           - Diverticulosis in the entire examined colon.                           -  Congested mucosa in the sigmoid colon and in the                            descending colon. Biopsied.                           - Normal mucosa in the entire examined colon                            otherwise.                           - Non-bleeding non-thrombosed external and internal                            hemorrhoids. Recommendation:           - The patient will be observed post-procedure,                            until all discharge criteria are met.                           - Discharge patient to home.                           - Patient has a contact number available for                            emergencies. The signs and symptoms of potential                            delayed complications were discussed with the                            patient. Return to normal activities tomorrow.                            Written discharge instructions were provided to the                            patient.                           - High fiber diet.                           - Use FiberCon 1-2 tablets PO daily.                           - Continue present medications.                           - Await pathology results.                           - Repeat colonoscopy in 10 years for screening  purposes (as long as chronic colitis is not noted).                           - The findings and recommendations were discussed                            with the patient.                           - The findings and recommendations  were discussed                            with the patient's family. Justice Britain, MD 01/30/2022 2:53:39 PM

## 2022-01-30 NOTE — Progress Notes (Signed)
Called to room to assist during endoscopic procedure.  Patient ID and intended procedure confirmed with present staff. Received instructions for my participation in the procedure from the performing physician.  

## 2022-01-30 NOTE — Progress Notes (Unsigned)
GASTROENTEROLOGY PROCEDURE H&P NOTE   Primary Care Physician: Billie Ruddy, MD  HPI: Haley Rodriguez is a 51 y.o. female who presents for EGD/Colonoscopy for evaluation of GERD (Barrett's screening), history of diverticulitis with prior perforation, and abnormal CT of the colon, Colon Cancer screening.  Past Medical History:  Diagnosis Date   GERD (gastroesophageal reflux disease)    Heart murmur    Hypertension    History reviewed. No pertinent surgical history. Current Outpatient Medications  Medication Sig Dispense Refill   chlorthalidone (HYGROTON) 25 MG tablet TAKE 1 TABLET BY MOUTH EVERY DAY (Patient taking differently: Take 25 mg by mouth daily.) 90 tablet 1   BD PEN NEEDLE NANO 2ND GEN 32G X 4 MM MISC USE 1 NEEDLE FOR INJECTION DAILY     etodolac (LODINE) 400 MG tablet Take 400 mg by mouth daily as needed (pain). (Patient not taking: Reported on 12/12/2021)     Liraglutide -Weight Management (SAXENDA [place]) Inject 3 Units into the skin daily.     meloxicam (MOBIC) 7.5 MG tablet Take 7.5 mg by mouth daily.     pantoprazole (PROTONIX) 40 MG tablet Take 40 mg by mouth See admin instructions. Take 40 mg by mouth 1-2 times a day     Current Facility-Administered Medications  Medication Dose Route Frequency Provider Last Rate Last Admin   0.9 %  sodium chloride infusion  500 mL Intravenous Once Mansouraty, Telford Nab., MD        Current Outpatient Medications:    chlorthalidone (HYGROTON) 25 MG tablet, TAKE 1 TABLET BY MOUTH EVERY DAY (Patient taking differently: Take 25 mg by mouth daily.), Disp: 90 tablet, Rfl: 1   BD PEN NEEDLE NANO 2ND GEN 32G X 4 MM MISC, USE 1 NEEDLE FOR INJECTION DAILY, Disp: , Rfl:    etodolac (LODINE) 400 MG tablet, Take 400 mg by mouth daily as needed (pain). (Patient not taking: Reported on 12/12/2021), Disp: , Rfl:    Liraglutide -Weight Management (SAXENDA [place]), Inject 3 Units into the skin daily., Disp: , Rfl:    meloxicam (MOBIC) 7.5 MG  tablet, Take 7.5 mg by mouth daily., Disp: , Rfl:    pantoprazole (PROTONIX) 40 MG tablet, Take 40 mg by mouth See admin instructions. Take 40 mg by mouth 1-2 times a day, Disp: , Rfl:   Current Facility-Administered Medications:    0.9 %  sodium chloride infusion, 500 mL, Intravenous, Once, Mansouraty, Telford Nab., MD No Known Allergies Family History  Problem Relation Age of Onset   Heart failure Father    Colon cancer Neg Hx    Stomach cancer Neg Hx    Esophageal cancer Neg Hx    Inflammatory bowel disease Neg Hx    Liver disease Neg Hx    Pancreatic cancer Neg Hx    Rectal cancer Neg Hx    Social History   Socioeconomic History   Marital status: Single    Spouse name: Not on file   Number of children: Not on file   Years of education: Not on file   Highest education level: Not on file  Occupational History   Not on file  Tobacco Use   Smoking status: Never   Smokeless tobacco: Never  Vaping Use   Vaping Use: Never used  Substance and Sexual Activity   Alcohol use: Yes   Drug use: Never   Sexual activity: Yes  Other Topics Concern   Not on file  Social History Narrative   Not on  file   Social Determinants of Health   Financial Resource Strain: Not on file  Food Insecurity: Not on file  Transportation Needs: Not on file  Physical Activity: Not on file  Stress: Not on file  Social Connections: Not on file  Intimate Partner Violence: Not on file    Physical Exam: Today's Vitals   01/30/22 1402  BP: (!) 166/84  Pulse: 91  Temp: 97.8 F (36.6 C)  TempSrc: Temporal  SpO2: 100%  Weight: 190 lb (86.2 kg)  Height: '5\' 7"'$  (1.702 m)   Body mass index is 29.76 kg/m. GEN: NAD EYE: Sclerae anicteric ENT: MMM CV: Non-tachycardic GI: Soft, NT/ND NEURO:  Alert & Oriented x 3  Lab Results: No results for input(s): "WBC", "HGB", "HCT", "PLT" in the last 72 hours. BMET No results for input(s): "NA", "K", "CL", "CO2", "GLUCOSE", "BUN", "CREATININE", "CALCIUM"  in the last 72 hours. LFT No results for input(s): "PROT", "ALBUMIN", "AST", "ALT", "ALKPHOS", "BILITOT", "BILIDIR", "IBILI" in the last 72 hours. PT/INR No results for input(s): "LABPROT", "INR" in the last 72 hours.   Impression / Plan: This is a 51 y.o.female who presents for EGD/Colonoscopy for evaluation of GERD (Barrett's screening), history of diverticulitis with prior perforation, and abnormal CT of the colon, Colon Cancer screening.  The risks and benefits of endoscopic evaluation/treatment were discussed with the patient and/or family; these include but are not limited to the risk of perforation, infection, bleeding, missed lesions, lack of diagnosis, severe illness requiring hospitalization, as well as anesthesia and sedation related illnesses.  The patient's history has been reviewed, patient examined, no change in status, and deemed stable for procedure.  The patient and/or family is agreeable to proceed.    Justice Britain, MD [facility] Advanced Endoscopy Office # [phone]

## 2022-01-30 NOTE — Patient Instructions (Signed)
Impression/Recommendations:  Hiatal hernia, hemorrhoid, diverticulosis, high fiber diet, and gastritis handouts given to patient.  Continue present medications. Await pathology results.  Use FiberCon 1-2 tablets by mouth daily.  Repeat colonoscopy in 10 years for screening purposes, as long as chronic colitis is not noted.  YOU HAD AN ENDOSCOPIC PROCEDURE TODAY AT [facility]:   Refer to the procedure report that was given to you for any specific questions about what was found during the examination.  If the procedure report does not answer your questions, please call your gastroenterologist to clarify.  If you requested that your care partner not be given the details of your procedure findings, then the procedure report has been included in a sealed envelope for you to review at your convenience later.  YOU SHOULD EXPECT: Some feelings of bloating in the abdomen. Passage of more gas than usual.  Walking can help get rid of the air that was put into your GI tract during the procedure and reduce the bloating. If you had a lower endoscopy (such as a colonoscopy or flexible sigmoidoscopy) you may notice spotting of blood in your stool or on the toilet paper. If you underwent a bowel prep for your procedure, you may not have a normal bowel movement for a few days.  Please Note:  You might notice some irritation and congestion in your nose or some drainage.  This is from the oxygen used during your procedure.  There is no need for concern and it should clear up in a day or so.  SYMPTOMS TO REPORT IMMEDIATELY:  Following lower endoscopy (colonoscopy or flexible sigmoidoscopy):  Excessive amounts of blood in the stool  Significant tenderness or worsening of abdominal pains  Swelling of the abdomen that is new, acute  Fever of 100F or higher  Following upper endoscopy (EGD)  Vomiting of blood or coffee ground material  New chest pain or pain under the shoulder blades  Painful or  persistently difficult swallowing  New shortness of breath  Fever of 100F or higher  Black, tarry-looking stools  For urgent or emergent issues, a gastroenterologist can be reached at any hour by calling [phone]. Do not use MyChart messaging for urgent concerns.    DIET:  We do recommend a small meal at first, but then you may proceed to your regular diet.  Drink plenty of fluids but you should avoid alcoholic beverages for 24 hours.  ACTIVITY:  You should plan to take it easy for the rest of today and you should NOT DRIVE or use heavy machinery until tomorrow (because of the sedation medicines used during the test).    FOLLOW UP: Our staff will call the number listed on your records the next business day following your procedure.  We will call around 7:15- 8:00 am to check on you and address any questions or concerns that you may have regarding the information given to you following your procedure. If we do not reach you, we will leave a message.     If any biopsies were taken you will be contacted by phone or by letter within the next 1-3 weeks.  Please call us at [phone] if you have not heard about the biopsies in 3 weeks.    SIGNATURES/CONFIDENTIALITY: You and/or your care partner have signed paperwork which will be entered into your electronic medical record.  These signatures attest to the fact that that the information above on your After Visit Summary has been reviewed and is  understood.  Full responsibility of the confidentiality of this discharge information lies with you and/or your care-partner. 

## 2022-01-30 NOTE — Op Note (Signed)
[facility] Patient Name: Haley Rodriguez Procedure Date: 01/30/2022 2:09 PM MRN: [id number] Endoscopist: Justice Britain , MD, [id number] Age: 51 Referring MD:  Date of Birth: 05/24/1970 Gender: Female Account #: [id number] Procedure:                Upper GI endoscopy Indications:              Heartburn, Screening for Barrett's esophagus in                            patient at risk for this condition Medicines:                Monitored Anesthesia Care Procedure:                Pre-Anesthesia Assessment:                           - Prior to the procedure, a History and Physical                            was performed, and patient medications and                            allergies were reviewed. The patient's tolerance of                            previous anesthesia was also reviewed. The risks                            and benefits of the procedure and the sedation                            options and risks were discussed with the patient.                            All questions were answered, and informed consent                            was obtained. Prior Anticoagulants: The patient has                            taken no anticoagulant or antiplatelet agents                            except for NSAID medication. ASA Grade Assessment:                            II - A patient with mild systemic disease. After                            reviewing the risks and benefits, the patient was                            deemed in satisfactory condition to undergo the  procedure.                           After obtaining informed consent, the endoscope was                            passed under direct vision. Throughout the                            procedure, the patient's blood pressure, pulse, and                            oxygen saturations were monitored continuously. The                            Endoscope was introduced through the  mouth, and                            advanced to the second part of duodenum. The upper                            GI endoscopy was accomplished without difficulty.                            The patient tolerated the procedure. Scope In: Scope Out: Findings:                 No gross lesions were noted in the entire esophagus.                           A widely patent Schatzki ring was found at the                            gastroesophageal junction.                           The Z-line was regular and was found 38 cm from the                            incisors.                           A 2 cm hiatal hernia was present.                           Patchy mild inflammation characterized by erosions                            and erythema was found in the entire examined                            stomach. Biopsies were taken with a cold forceps                            for histology and Helicobacter pylori testing.  No gross lesions were noted in the duodenal bulb,                            in the first portion of the duodenum and in the                            second portion of the duodenum. Complications:            No immediate complications. Estimated Blood Loss:     Estimated blood loss was minimal. Impression:               - No gross lesions in the entire esophagus. Widely                            patent Schatzki ring. Z-line regular, 38 cm from                            the incisors.                           - 2 cm hiatal hernia.                           - Gastritis. Biopsied.                           - No gross lesions in the duodenal bulb, in the                            first portion of the duodenum and in the second                            portion of the duodenum. Recommendation:           - Proceed to scheduled colonoscopy.                           - Continue present medications.                           - Await pathology  results.                           - If dysphagia symptoms develop in future, consider                            role of disruption/dilation.                           - The findings and recommendations were discussed                            with the patient.                           - The findings and recommendations were discussed  with the patient's family. Justice Britain, MD 01/30/2022 2:48:08 PM

## 2022-02-02 ENCOUNTER — Telehealth: Payer: Self-pay

## 2022-02-02 NOTE — Telephone Encounter (Signed)
  Follow up Call-     01/30/2022    2:04 PM  Call back number  Post procedure Call Back phone  # [phone]  Permission to leave phone message Yes     Patient questions:  Do you have a fever, pain , or abdominal swelling? No. Pain Score  0 *  Have you tolerated food without any problems? Yes.    Have you been able to return to your normal activities? Yes.    Do you have any questions about your discharge instructions: Diet   No. Medications  No. Follow up visit  No.  Do you have questions or concerns about your Care? No.  Actions: * If pain score is 4 or above: No action needed, pain <4.

## 2022-02-04 ENCOUNTER — Encounter: Payer: Self-pay | Admitting: Gastroenterology

## 2022-02-06 DIAGNOSIS — D251 Intramural leiomyoma of uterus: Secondary | ICD-10-CM | POA: Diagnosis not present

## 2022-04-10 DIAGNOSIS — Z01818 Encounter for other preprocedural examination: Secondary | ICD-10-CM | POA: Diagnosis not present

## 2022-04-10 DIAGNOSIS — D259 Leiomyoma of uterus, unspecified: Secondary | ICD-10-CM | POA: Diagnosis not present

## 2022-04-15 DIAGNOSIS — N888 Other specified noninflammatory disorders of cervix uteri: Secondary | ICD-10-CM | POA: Diagnosis not present

## 2022-04-15 DIAGNOSIS — D251 Intramural leiomyoma of uterus: Secondary | ICD-10-CM | POA: Diagnosis not present

## 2022-04-15 DIAGNOSIS — D259 Leiomyoma of uterus, unspecified: Secondary | ICD-10-CM | POA: Diagnosis not present

## 2022-04-15 DIAGNOSIS — Z01810 Encounter for preprocedural cardiovascular examination: Secondary | ICD-10-CM | POA: Diagnosis not present

## 2022-05-04 DIAGNOSIS — Z4889 Encounter for other specified surgical aftercare: Secondary | ICD-10-CM | POA: Diagnosis not present

## 2022-05-04 DIAGNOSIS — D251 Intramural leiomyoma of uterus: Secondary | ICD-10-CM | POA: Diagnosis not present

## 2022-05-04 DIAGNOSIS — Z90710 Acquired absence of both cervix and uterus: Secondary | ICD-10-CM | POA: Diagnosis not present

## 2022-06-05 DIAGNOSIS — D251 Intramural leiomyoma of uterus: Secondary | ICD-10-CM | POA: Diagnosis not present

## 2022-06-05 DIAGNOSIS — Z4889 Encounter for other specified surgical aftercare: Secondary | ICD-10-CM | POA: Diagnosis not present

## 2022-06-05 DIAGNOSIS — Z90710 Acquired absence of both cervix and uterus: Secondary | ICD-10-CM | POA: Diagnosis not present

## 2022-08-21 ENCOUNTER — Ambulatory Visit: Payer: BC Managed Care – PPO | Admitting: Podiatry

## 2022-08-21 ENCOUNTER — Encounter: Payer: Self-pay | Admitting: Podiatry

## 2022-08-21 DIAGNOSIS — L600 Ingrowing nail: Secondary | ICD-10-CM | POA: Diagnosis not present

## 2022-08-21 DIAGNOSIS — B351 Tinea unguium: Secondary | ICD-10-CM

## 2022-09-22 NOTE — Progress Notes (Signed)
Subjective:   Patient ID: Haley Rodriguez, female   DOB: 52 y.o.   MRN: [id number]   HPI patient presents stating that she is very concerned about discoloration of nailbeds bleeding and also history of ingrown toenails.  Also has history of bunion deformity that she has questions about     ROS      Objective:  Physical Exam  Neurovascular status found to be intact muscle strength was found to be within normal limits with patient noted to have thickened nailbeds 1-5 both feet with yellow discoloration incurvation of nail corners with moderate structural bunion deformity     Assessment:  Chronic problems with mycotic nail infection ingrown toenail deformity bunion deformity     Plan:  H&P reviewed at great length and reviewed patient's health history.  Do not recommend aggressive treatment I have recommended debridement which I did courtesy today I recommended soaks I have recommended topical medicines and wider shoe beds.  Patient will be seen back as needed may require nail removal in future

## 2023-01-28 DIAGNOSIS — Z90710 Acquired absence of both cervix and uterus: Secondary | ICD-10-CM | POA: Diagnosis not present

## 2023-01-28 DIAGNOSIS — Z01419 Encounter for gynecological examination (general) (routine) without abnormal findings: Secondary | ICD-10-CM | POA: Diagnosis not present

## 2023-03-05 ENCOUNTER — Telehealth: Payer: Self-pay | Admitting: Family Medicine

## 2023-03-05 NOTE — Telephone Encounter (Signed)
 Last Visit:  10/15/2021 Called Pt to schedule a CPE/OV.  LVM for Pt to call us back to schedule.

## 2023-03-15 ENCOUNTER — Encounter: Payer: Self-pay | Admitting: Family Medicine

## 2023-03-15 ENCOUNTER — Ambulatory Visit (INDEPENDENT_AMBULATORY_CARE_PROVIDER_SITE_OTHER): Payer: BC Managed Care – PPO | Admitting: Family Medicine

## 2023-03-15 VITALS — BP 140/80 | HR 78 | Temp 98.3°F | Ht 67.0 in | Wt 197.2 lb

## 2023-03-15 DIAGNOSIS — Z0000 Encounter for general adult medical examination without abnormal findings: Secondary | ICD-10-CM | POA: Diagnosis not present

## 2023-03-15 DIAGNOSIS — Z23 Encounter for immunization: Secondary | ICD-10-CM | POA: Diagnosis not present

## 2023-03-15 DIAGNOSIS — K2100 Gastro-esophageal reflux disease with esophagitis, without bleeding: Secondary | ICD-10-CM

## 2023-03-15 DIAGNOSIS — Z0001 Encounter for general adult medical examination with abnormal findings: Secondary | ICD-10-CM

## 2023-03-15 DIAGNOSIS — R1310 Dysphagia, unspecified: Secondary | ICD-10-CM | POA: Diagnosis not present

## 2023-03-15 DIAGNOSIS — I10 Essential (primary) hypertension: Secondary | ICD-10-CM | POA: Diagnosis not present

## 2023-03-15 DIAGNOSIS — Z8719 Personal history of other diseases of the digestive system: Secondary | ICD-10-CM

## 2023-03-15 DIAGNOSIS — R1032 Left lower quadrant pain: Secondary | ICD-10-CM | POA: Diagnosis not present

## 2023-03-15 MED ORDER — AMOXICILLIN-POT CLAVULANATE 875-125 MG PO TABS: 1.0000 | ORAL_TABLET | Freq: Three times a day (TID) | ORAL | 0 refills | Status: AC

## 2023-03-15 NOTE — Progress Notes (Signed)
Established Patient Office Visit   Subjective  Patient ID: Haley Rodriguez, female    DOB: 17-Aug-1970  Age: 53 y.o. MRN: [id number]  Chief Complaint  Patient presents with   Annual Exam    Patient is a 53 year old female seen for CPE.  Patient is not fasting.  Patient has not taken meds today.  Ate breakfast around 7 AM at [facility] including hashbrowns and eggs.  Patient endorses continued heartburn, food feeling stuck in throat, bloating, gas pain.  Pt inquires about diverticulitis flare due to increased LLQ pain x 2 days.  Having frequent, loose bowel movements in the last few days.  Endorses increase pressure in rectum.  Denies blood in stools, mucus in stools, change in appetite, fever, chills, nausea, vomiting.  Last GI visit 2023 for EGD.  Taking pantoprazole daily.  In the past took as needed.  Pt was hospitalized in 2023 due to diverticulitis.    Patient Active Problem List   Diagnosis Date Noted   History of diverticulitis 12/12/2021   Diverticulitis of colon with perforation 12/12/2021   Gastroesophageal reflux disease 12/12/2021   Abdominal pain, left lower quadrant 12/12/2021   Acute diverticulitis 10/03/2021   Hypokalemia 10/03/2021   Hyperproteinemia 10/03/2021   Overweight (BMI 25.0-29.9) 10/03/2021   Hypophosphatemia 10/03/2021   Mallet finger of left hand 07/18/2021   Fibroid uterus 03/28/2021   Hyperlipidemia 08/23/2020   Essential hypertension 08/23/2020   Past Medical History:  Diagnosis Date   GERD (gastroesophageal reflux disease)    Heart murmur    Hypertension    History reviewed. No pertinent surgical history. Social History   Tobacco Use   Smoking status: Never   Smokeless tobacco: Never  Vaping Use   Vaping status: Never Used  Substance Use Topics   Alcohol use: Yes   Drug use: Never   Family History  Problem Relation Age of Onset   Heart failure Father    Colon cancer Neg Hx    Stomach cancer Neg Hx    Esophageal cancer Neg  Hx    Inflammatory bowel disease Neg Hx    Liver disease Neg Hx    Pancreatic cancer Neg Hx    Rectal cancer Neg Hx    No Known Allergies    ROS Negative unless stated above    Objective:     BP (!) 144/82 (BP Location: Left Arm, Patient Position: Sitting, Cuff Size: Large)   Pulse 78   Temp 98.3 F (36.8 C) (Oral)   Ht 5\' 7"  (1.702 m)   Wt 197 lb 3.2 oz (89.4 kg)   LMP  (LMP Unknown)   SpO2 98%   BMI 30.89 kg/m  BP Readings from Last 3 Encounters:  03/15/23 (!) 144/82  01/30/22 129/81  12/12/21 130/72   Wt Readings from Last 3 Encounters:  03/15/23 197 lb 3.2 oz (89.4 kg)  01/30/22 190 lb (86.2 kg)  12/12/21 190 lb (86.2 kg)    Physical Exam Constitutional:      Appearance: Normal appearance.  HENT:     Head: Normocephalic and atraumatic.     Right Ear: Tympanic membrane, ear canal and external ear normal.     Left Ear: Tympanic membrane, ear canal and external ear normal.     Nose: Nose normal.     Mouth/Throat:     Mouth: Mucous membranes are moist.     Pharynx: No oropharyngeal exudate or posterior oropharyngeal erythema.  Eyes:     General: No scleral icterus.  Extraocular Movements: Extraocular movements intact.     Conjunctiva/sclera: Conjunctivae normal.     Pupils: Pupils are equal, round, and reactive to light.  Neck:     Thyroid: No thyromegaly.  Cardiovascular:     Rate and Rhythm: Normal rate and regular rhythm.     Pulses: Normal pulses.     Heart sounds: Normal heart sounds. No murmur heard.    No friction rub.  Pulmonary:     Effort: Pulmonary effort is normal.     Breath sounds: Normal breath sounds. No wheezing, rhonchi or rales.  Abdominal:     General: Bowel sounds are increased.     Palpations: Abdomen is soft.     Tenderness: There is abdominal tenderness in the left lower quadrant. There is no guarding or rebound.       Comments: Moderate TTP in LLQ.  Musculoskeletal:        General: No deformity. Normal range of motion.   Lymphadenopathy:     Cervical: No cervical adenopathy.  Skin:    General: Skin is warm and dry.     Findings: No lesion.  Neurological:     General: No focal deficit present.     Mental Status: She is alert and oriented to person, place, and time.  Psychiatric:        Mood and Affect: Mood normal.        Thought Content: Thought content normal.       03/15/2023    4:05 PM 10/03/2021    9:32 AM 08/23/2020    2:14 PM  Depression screen PHQ 2/9  Decreased Interest 0 0 0  Down, Depressed, Hopeless 0 0 0  PHQ - 2 Score 0 0 0  Altered sleeping 0 0 0  Tired, decreased energy 0 0 0  Change in appetite 0 0 0  Feeling bad or failure about yourself  0 0 0  Trouble concentrating 0 0 0  Moving slowly or fidgety/restless 0 0 0  Suicidal thoughts 0 0 0  PHQ-9 Score 0 0 0  Difficult doing work/chores Not difficult at all Not difficult at all       03/15/2023    4:05 PM 08/23/2020    4:52 PM  GAD 7 : Generalized Anxiety Score  Nervous, Anxious, on Edge 0 0  Control/stop worrying 0 0  Worry too much - different things 0 0  Trouble relaxing 0 0  Restless 0 0  Easily annoyed or irritable 0 1  Afraid - awful might happen 0 0  Total GAD 7 Score 0 1  Anxiety Difficulty Not difficult at all Not difficult at all    No results found for any visits on 03/15/23.    Assessment & Plan:  Encounter for well adult exam with abnormal findings -Age-appropriate health screenings discussed. Anticipatory guidance given including wearing seatbelts, smoke detectors in the home, increasing physical activity, increasing p.o. intake of water and vegetables. -Obtain labs -Immunizations reviewed.  Tdap given this visit.  Patient declines influenza vaccine. -Patient to schedule mammogram -Colonoscopy done 01/30/2022 -Pap not indicated 2/2 hysterectomy -Next CPE in 1 year -     Hemoglobin A1c; Future -     Lipid panel; Future  Abdominal pain, left lower quadrant -Concern for diverticulitis flare. -Obtain  labs -Start Augmentin 875-125 mg 3 times daily -Bland diet, advance as tolerated. -Given strict precautions for continued or worsening symptoms -Referral to GI placed for follow-up. -     CBC with Differential/Platelet; Future -  Comprehensive metabolic panel; Future -     Amoxicillin-Pot Clavulanate; Take 1 tablet by mouth in the morning, at noon, and at bedtime for 7 days.  Dispense: 20 tablet; Refill: 0 -     Ambulatory referral to Gastroenterology  Dysphagia, unspecified type -EGD done 01/30/2022.  At the time was asymptomatic however a widely patent Schatzki's ring was noted. -Per EGD report if dysphagia symptoms develop in future consider role of disruption/dilation. -Continued symptoms despite PPI. -Continue lifestyle modifications. -     Ambulatory referral to Gastroenterology  Gastroesophageal reflux disease with esophagitis without hemorrhage -EGD from 01/30/2022 with widely patent Schatzki's ring.  2 cm hiatal hernia.  Gastritis.  Biopsies taken. -     Ambulatory referral to Gastroenterology  Essential hypertension -Elevated.  Did not take BP meds today. -Patient encouraged to take BP meds consistently -Lifestyle modifications encouraged -Continue chlorthalidone 25 mg daily -     Comprehensive metabolic panel; Future -     TSH; Future -     T4, free; Future  History of diverticulitis -     Amoxicillin-Pot Clavulanate; Take 1 tablet by mouth in the morning, at noon, and at bedtime for 7 days.  Dispense: 20 tablet; Refill: 0 -     Ambulatory referral to Gastroenterology  Need for tetanus booster -     Tdap vaccine greater than or equal to 7yo IM  Return in about 4 weeks (around 04/12/2023), or if symptoms worsen or fail to improve.   Deeann Saint, MD

## 2023-03-15 NOTE — Patient Instructions (Addendum)
A prescription for Augmentin, an antibiotic was sent to your pharmacy to take 3 times a day for diverticulitis.  If you continue to have symptoms or they become worse these are things you should let the clinic know.  A referral was also placed for you to see the gastroenterologist.  They will call you about setting up an appointment.

## 2023-03-16 LAB — COMPREHENSIVE METABOLIC PANEL
ALT: 11 U/L (ref 0–35)
AST: 18 U/L (ref 0–37)
Albumin: 4.3 g/dL (ref 3.5–5.2)
Alkaline Phosphatase: 117 U/L (ref 39–117)
BUN: 13 mg/dL (ref 6–23)
CO2: 30 meq/L (ref 19–32)
Calcium: 9.7 mg/dL (ref 8.4–10.5)
Chloride: 106 meq/L (ref 96–112)
Creatinine, Ser: 0.65 mg/dL (ref 0.40–1.20)
GFR: 99.73 mL/min (ref >60.00)
Glucose, Bld: 79 mg/dL (ref 70–99)
Potassium: 4.2 meq/L (ref 3.5–5.1)
Sodium: 144 meq/L (ref 135–145)
Total Bilirubin: 0.2 mg/dL (ref 0.2–1.2)
Total Protein: 7.8 g/dL (ref 6.0–8.3)

## 2023-03-16 LAB — HEMOGLOBIN A1C: Hgb A1c MFr Bld: 6.2 % (ref 4.6–6.5)

## 2023-03-16 LAB — LIPID PANEL
Cholesterol: 199 mg/dL (ref 0–200)
HDL: 56.6 mg/dL (ref >39.00)
LDL Cholesterol: 128 mg/dL — ABNORMAL HIGH (ref 0–99)
NonHDL: 142.14
Total CHOL/HDL Ratio: 4
Triglycerides: 70 mg/dL (ref 0.0–149.0)
VLDL: 14 mg/dL (ref 0.0–40.0)

## 2023-03-16 LAB — CBC WITH DIFFERENTIAL/PLATELET
Basophils Absolute: 0.1 10*3/uL (ref 0.0–0.1)
Basophils Relative: 0.8 % (ref 0.0–3.0)
Eosinophils Absolute: 0.2 10*3/uL (ref 0.0–0.7)
Eosinophils Relative: 2 % (ref 0.0–5.0)
HCT: 40.9 % (ref 36.0–46.0)
Hemoglobin: 13.3 g/dL (ref 12.0–15.0)
Lymphocytes Relative: 32.2 % (ref 12.0–46.0)
Lymphs Abs: 3 10*3/uL (ref 0.7–4.0)
MCHC: 32.5 g/dL (ref 30.0–36.0)
MCV: 88.9 fL (ref 78.0–100.0)
Monocytes Absolute: 0.8 10*3/uL (ref 0.1–1.0)
Monocytes Relative: 8.4 % (ref 3.0–12.0)
Neutro Abs: 5.3 10*3/uL (ref 1.4–7.7)
Neutrophils Relative %: 56.6 % (ref 43.0–77.0)
Platelets: 292 10*3/uL (ref 150.0–400.0)
RBC: 4.59 Mil/uL (ref 3.87–5.11)
RDW: 14.5 % (ref 11.5–15.5)
WBC: 9.3 10*3/uL (ref 4.0–10.5)

## 2023-03-16 LAB — TSH: TSH: 0.96 u[IU]/mL (ref 0.35–5.50)

## 2023-03-16 LAB — T4, FREE: Free T4: 0.87 ng/dL (ref 0.60–1.60)

## 2023-03-30 ENCOUNTER — Encounter: Payer: Self-pay | Admitting: Family Medicine

## 2023-04-12 ENCOUNTER — Ambulatory Visit (INDEPENDENT_AMBULATORY_CARE_PROVIDER_SITE_OTHER): Payer: BC Managed Care – PPO | Admitting: Family Medicine

## 2023-04-12 ENCOUNTER — Encounter: Payer: Self-pay | Admitting: Family Medicine

## 2023-04-12 VITALS — BP 148/78 | HR 97 | Temp 99.2°F | Ht 67.0 in | Wt 204.4 lb

## 2023-04-12 DIAGNOSIS — R1032 Left lower quadrant pain: Secondary | ICD-10-CM

## 2023-04-12 DIAGNOSIS — R635 Abnormal weight gain: Secondary | ICD-10-CM

## 2023-04-12 DIAGNOSIS — G4700 Insomnia, unspecified: Secondary | ICD-10-CM

## 2023-04-12 DIAGNOSIS — I10 Essential (primary) hypertension: Secondary | ICD-10-CM | POA: Diagnosis not present

## 2023-04-12 NOTE — Progress Notes (Signed)
 Established Patient Office Visit   Subjective  Patient ID: Haley Rodriguez, female    DOB: Oct 24, 1970  Age: 53 y.o. MRN: [id number]  Chief Complaint  Patient presents with   Medical Management of Chronic Issues    Pt is a 53 yo female seen for f/u.  Pt stopped taking Saxenda 3 mg daily as prescribed by NP at her job.  She has since noticed she is no longer having abd pain.  Concerned about potential wt gain.  Taking chlorthalidone 25 mg for HTN.  Pt mentions difficulty with insomnia.  OTC meds not helpful.  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   Patient Active Problem List   Diagnosis Date Noted   History of diverticulitis 12/12/2021   Diverticulitis of colon with perforation 12/12/2021   Gastroesophageal reflux disease 12/12/2021   Abdominal pain, left lower quadrant 12/12/2021   Acute diverticulitis 10/03/2021   Hypokalemia 10/03/2021   Hyperproteinemia 10/03/2021   Overweight (BMI 25.0-29.9) 10/03/2021   Hypophosphatemia 10/03/2021   Mallet finger of left hand 07/18/2021   Fibroid uterus 03/28/2021   Hyperlipidemia 08/23/2020   Essential hypertension 08/23/2020   Past Medical History:  Diagnosis Date   GERD (gastroesophageal reflux disease)    Heart murmur    Hypertension    No past surgical history on file. Social History   Tobacco Use   Smoking status: Never   Smokeless tobacco: Never  Vaping Use   Vaping status: Never Used  Substance Use Topics   Alcohol use: Yes   Drug use: Never   Family History  Problem Relation Age of Onset   Heart failure Father    Colon cancer Neg Hx    Stomach cancer Neg Hx    Esophageal cancer Neg Hx    Inflammatory bowel disease Neg Hx    Liver disease Neg Hx    Pancreatic cancer Neg Hx    Rectal cancer Neg Hx    No Known Allergies    ROS Negative unless stated above    Objective:     BP (!) 148/78   Pulse 97   Temp 99.2 F (37.3 C) (Oral)   Ht 5\' 7"  (1.702 m)   Wt 204 lb 6.4 oz (92.7 kg)   LMP   (LMP Unknown)   SpO2 96%   BMI 32.01 kg/m  BP Readings from Last 3 Encounters:  04/12/23 (!) 148/78  03/15/23 (!) 140/80  01/30/22 129/81   Wt Readings from Last 3 Encounters:  04/12/23 204 lb 6.4 oz (92.7 kg)  03/15/23 197 lb 3.2 oz (89.4 kg)  01/30/22 190 lb (86.2 kg)      Physical Exam Constitutional:      General: She is not in acute distress.    Appearance: Normal appearance.  HENT:     Head: Normocephalic and atraumatic.     Nose: Nose normal.     Mouth/Throat:     Mouth: Mucous membranes are moist.  Cardiovascular:     Rate and Rhythm: Normal rate and regular rhythm.     Heart sounds: Normal heart sounds. No murmur heard.    No gallop.  Pulmonary:     Effort: Pulmonary effort is normal. No respiratory distress.     Breath sounds: Normal breath sounds. No wheezing, rhonchi or rales.  Skin:    General: Skin is warm and dry.  Neurological:     Mental Status: She is alert and oriented to person, place, and time.    No results found  for any visits on 04/12/23.    Assessment & Plan:  Essential hypertension -uncontrolled -need for additional medication advised.  Pt wishes to continue lifestyle modifications. -continue chlorthalidone 25 mg daily -monitor bp at home.  For continued elevations >140/90 advised on need for additional meds. -f/u in 1 month  Weight gain -Body mass index is 32.01 kg/m. -7 lbs wt gain since d/c'ing sazenda 3 mg daily -discussed lifestyle modifications.  Pt will try  going to the gym after work -consider wt management referral.  Abdominal pain, left lower quadrant -resolved upon d/c'ing saxenda -continue to monitor  Insomnia, unspecified type -sleep hygiene discussed -pt to start working out after work.  No follow-ups on file.  F/u in 4-6 wks, sooner if needed.  Deeann Saint, MD

## 2023-04-18 ENCOUNTER — Encounter: Payer: Self-pay | Admitting: Family Medicine

## 2023-06-03 ENCOUNTER — Ambulatory Visit: Admitting: Gastroenterology

## 2023-07-02 ENCOUNTER — Encounter (HOSPITAL_COMMUNITY): Payer: Self-pay

## 2023-07-09 ENCOUNTER — Ambulatory Visit (INDEPENDENT_AMBULATORY_CARE_PROVIDER_SITE_OTHER): Payer: BC Managed Care – PPO | Admitting: Family Medicine

## 2023-07-09 ENCOUNTER — Encounter: Payer: Self-pay | Admitting: Family Medicine

## 2023-07-09 VITALS — BP 140/82 | HR 94 | Temp 99.2°F | Ht 67.0 in | Wt 200.2 lb

## 2023-07-09 DIAGNOSIS — E7841 Elevated Lipoprotein(a): Secondary | ICD-10-CM

## 2023-07-09 DIAGNOSIS — D171 Benign lipomatous neoplasm of skin and subcutaneous tissue of trunk: Secondary | ICD-10-CM

## 2023-07-09 DIAGNOSIS — M545 Low back pain, unspecified: Secondary | ICD-10-CM | POA: Diagnosis not present

## 2023-07-09 DIAGNOSIS — I10 Essential (primary) hypertension: Secondary | ICD-10-CM

## 2023-07-09 DIAGNOSIS — Z6831 Body mass index (BMI) 31.0-31.9, adult: Secondary | ICD-10-CM

## 2023-07-09 DIAGNOSIS — E66811 Obesity, class 1: Secondary | ICD-10-CM

## 2023-07-09 DIAGNOSIS — R140 Abdominal distension (gaseous): Secondary | ICD-10-CM

## 2023-07-09 NOTE — Progress Notes (Addendum)
 Established Patient Office Visit   Subjective  Patient ID: Haley Rodriguez, female    DOB: [birth year]  Age: 53 y.o. MRN: [id number]  Chief Complaint  Patient presents with   Medical Management of Chronic Issues    3 month follow-up for Blood pressure and weight gain, patient needs Cholesterol recheck     Patient is a 53 year old female seen for follow-up.  Patient states she wants to work on weight gain.  Has more back and joint pain when over 200 lbs.  Feels better around 190 lbs.  Still on hard concrete floors at work also contributing to symptoms.  Also motivated to lose weight for insurance cost.  Patient states she needs to work on diet changes including decreasing intake of cookies and snacks.  Pt has a treadmill but not currently using.  Increased grumbling in stomach over the last few days.  Pt ate cabbage.  Inquires about what she needs to do to decrease symptoms.  States taking Dulcolax helps with constipation.  Feels better when taken.  May take Dulcolax twice a week.  Patient endorses needing to work on water intake.  Patient inquires if her kidneys could be giving her back pain.  Having pain on right lateral back, worse with increased weight.    Patient Active Problem List   Diagnosis Date Noted   History of diverticulitis 12/12/2021   Diverticulitis of colon with perforation 12/12/2021   Gastroesophageal reflux disease 12/12/2021   Abdominal pain, left lower quadrant 12/12/2021   Acute diverticulitis 10/03/2021   Hypokalemia 10/03/2021   Hyperproteinemia 10/03/2021   Overweight (BMI 25.0-29.9) 10/03/2021   Hypophosphatemia 10/03/2021   Mallet finger of left hand 07/18/2021   Fibroid uterus 03/28/2021   Hyperlipidemia 08/23/2020   Essential hypertension 08/23/2020   Past Medical History:  Diagnosis Date   GERD (gastroesophageal reflux disease)    Heart murmur    Hypertension    History reviewed. No pertinent surgical history. Social History   Tobacco Use    Smoking status: Never   Smokeless tobacco: Never  Vaping Use   Vaping status: Never Used  Substance Use Topics   Alcohol use: Yes   Drug use: Never   Family History  Problem Relation Age of Onset   Heart failure Father    Colon cancer Neg Hx    Stomach cancer Neg Hx    Esophageal cancer Neg Hx    Inflammatory bowel disease Neg Hx    Liver disease Neg Hx    Pancreatic cancer Neg Hx    Rectal cancer Neg Hx    No Known Allergies  ROS Negative unless stated above    Objective:      BP (!) 140/82 (BP Location: Left Arm, Patient Position: Sitting, Cuff Size: Large)   Pulse 94   Temp 99.2 F (37.3 C) (Oral)   Ht 5\' 7"  (1.702 m)   Wt 200 lb 3.2 oz (90.8 kg)   LMP  (LMP Unknown)   SpO2 99%   BMI 31.36 kg/m  BP Readings from Last 3 Encounters:  07/09/23 (!) 140/82  04/12/23 (!) 148/78  03/15/23 (!) 140/80   Wt Readings from Last 3 Encounters:  07/09/23 200 lb 3.2 oz (90.8 kg)  04/12/23 204 lb 6.4 oz (92.7 kg)  03/15/23 197 lb 3.2 oz (89.4 kg)      Physical Exam Constitutional:      General: She is not in acute distress.    Appearance: Normal appearance.  HENT:  Head: Normocephalic and atraumatic.     Nose: Nose normal.     Mouth/Throat:     Mouth: Mucous membranes are moist.  Cardiovascular:     Rate and Rhythm: Normal rate and regular rhythm.     Heart sounds: Normal heart sounds. No murmur heard.    No gallop.  Pulmonary:     Effort: Pulmonary effort is normal. No respiratory distress.     Breath sounds: Normal breath sounds. No wheezing, rhonchi or rales.  Skin:    General: Skin is warm and dry.          Comments: Mobile spongy like mass of R lateral low back/ flank, 3 cm x 1.5 cm.  Neurological:     Mental Status: She is alert and oriented to person, place, and time.        03/15/2023    4:05 PM 10/03/2021    9:32 AM 08/23/2020    2:14 PM  Depression screen PHQ 2/9  Decreased Interest 0 0 0  Down, Depressed, Hopeless 0 0 0  PHQ - 2  Score 0 0 0  Altered sleeping 0 0 0  Tired, decreased energy 0 0 0  Change in appetite 0 0 0  Feeling bad or failure about yourself  0 0 0  Trouble concentrating 0 0 0  Moving slowly or fidgety/restless 0 0 0  Suicidal thoughts 0 0 0  PHQ-9 Score 0 0 0  Difficult doing work/chores Not difficult at all Not difficult at all       03/15/2023    4:05 PM 08/23/2020    4:52 PM  GAD 7 : Generalized Anxiety Score  Nervous, Anxious, on Edge 0 0  Control/stop worrying 0 0  Worry too much - different things 0 0  Trouble relaxing 0 0  Restless 0 0  Easily annoyed or irritable 0 1  Afraid - awful might happen 0 0  Total GAD 7 Score 0 1  Anxiety Difficulty Not difficult at all Not difficult at all     No results found for any visits on 07/09/23.    Assessment & Plan:   Essential hypertension  Elevated lipoprotein(a) -     Lipid panel; Future  Lipoma of torso  Acute right-sided low back pain without sciatica  Bloating  Class 1 obesity with serious comorbidity and body mass index (BMI) of 31.0 to 31.9 in adult, unspecified obesity type  BP mildly improved since last OFV.  Recheck.  Discussed being consistent with lifestyle modifications and weight loss.  Continue chlorthalidone  25 mg daily.  Patient advised to monitor BP at home or at work.  For elevations consistently greater than 140/90 start additional medication.  Discussed increasing physical activity, decreasing snack/junk food, and increasing intake of vegetables.  Advised certain such as cabbage and broccoli are known to increase abdominal bloating/gas.  Lipoma noted on right lateral back/flank.  Given information regarding.  If removal desired will place referral to dermatology.  Return in about 4 months (around 10/26/2023) for blood pressure, chronic conditions.,  Sooner if needed.  Viola Greulich, MD

## 2023-07-10 LAB — LIPID PANEL
Chol/HDL Ratio: 3.9 ratio (ref 0.0–4.4)
Cholesterol, Total: 217 mg/dL — ABNORMAL HIGH (ref 100–199)
HDL: 55 mg/dL (ref >39)
LDL Chol Calc (NIH): 145 mg/dL — ABNORMAL HIGH (ref 0–99)
Triglycerides: 94 mg/dL (ref 0–149)
VLDL Cholesterol Cal: 17 mg/dL (ref 5–40)

## 2023-07-12 ENCOUNTER — Ambulatory Visit: Payer: Self-pay | Admitting: Family Medicine

## 2023-07-12 ENCOUNTER — Encounter: Payer: Self-pay | Admitting: Family Medicine

## 2023-07-12 MED ORDER — ROSUVASTATIN CALCIUM 5 MG PO TABS: 5.0000 mg | ORAL_TABLET | Freq: Every day | ORAL | 3 refills | Status: AC

## 2023-08-09 ENCOUNTER — Telehealth: Payer: Self-pay

## 2023-08-09 NOTE — Telephone Encounter (Signed)
 Spoke with patient, asking for letter for work to be able to sit as needed for work.

## 2023-08-09 NOTE — Telephone Encounter (Signed)
 Copied from CRM [id number]. Topic: General - Other >> Aug 09, 2023  8:13 AM Alica Antu wrote: Reason for CRM:  Patient called in requesting a written statement for PCP. Per patient she is requesting to speak to care team.    May you please assist.

## 2023-08-10 ENCOUNTER — Telehealth: Payer: Self-pay

## 2023-08-10 NOTE — Telephone Encounter (Signed)
 Copied from CRM [id number]. Topic: General - Other >> Aug 10, 2023  8:52 AM Adonis Hoot wrote: Reason for CRM: Patient is requesting a phone call from CMA to discuss the specifics of the letter that she is needed for her job,and what exactly needs to be placed on the letter. She said that she was upset yesterday and didn't give the specifics,she apologizes.

## 2023-08-10 NOTE — Telephone Encounter (Signed)
 Spoke with patient, patient needs the letter to state that she is able to sit walk and stand as needed.

## 2023-08-12 NOTE — Telephone Encounter (Signed)
 Copied from CRM [id number]. Topic: General - Other >> Aug 10, 2023  8:52 AM Adonis Hoot wrote: Reason for CRM: Patient is requesting a phone call from CMA to discuss the specifics of the letter that she is needed for her job,and what exactly needs to be placed on the letter. She said that she was upset yesterday and didn't give the specifics,she apologizes.

## 2023-08-12 NOTE — Telephone Encounter (Signed)
 Copied from CRM [id number]. Topic: General - Other >> Aug 10, 2023  8:52 AM Adonis Hoot wrote: Reason for CRM: Patient is requesting a phone call from CMA to discuss the specifics of the letter that she is needed for her job,and what exactly needs to be placed on the letter. She said that she was upset yesterday and didn't give the specifics,she apologizes.

## 2023-08-13 NOTE — Telephone Encounter (Signed)
 At last visit patient briefly mentioned back pain.  If having continued symptoms can place referral to sports med for further evaluation.

## 2023-08-16 NOTE — Telephone Encounter (Signed)
 Spoke to patient, patient states that her job is making her stand for 8 hrs, and her feet swelling and diverticulosis and back pain are the reason why she is needing the letter. Patient also stated that the back pain was okay, and didn't need a referral just a letter for work

## 2023-08-23 NOTE — Telephone Encounter (Signed)
 H/o diverticulosis does not warrant need for what pt is requesting.

## 2023-08-23 NOTE — Telephone Encounter (Signed)
 Unable to provide letter as would need to provide a reason for restrictions.

## 2023-08-24 NOTE — Telephone Encounter (Signed)
Called patient left a VM to return call

## 2023-08-25 ENCOUNTER — Telehealth: Payer: Self-pay

## 2023-08-25 NOTE — Telephone Encounter (Signed)
 Copied from CRM [id number]. Topic: General - Call Back - No Documentation >> Aug 25, 2023  9:56 AM Berneda FALCON wrote: Reason for CRM: Pt returning phone call to [facility]. I see the note in there but I am not positive as to the meaning. Called the office and got the voicemail.  Please call patient back at [phone]

## 2023-08-25 NOTE — Telephone Encounter (Signed)
 Spoke with patient she is aware, patient states she no longer needs a letter for work

## 2024-04-23 IMAGING — DX DG FINGER LITTLE 2+V*L*
3 series · 3 of 3 positions shown · non-contrast
Comparison: None.

CLINICAL DATA: Injury

EXAM:
LEFT LITTLE FINGER 2+V

[finger pa]
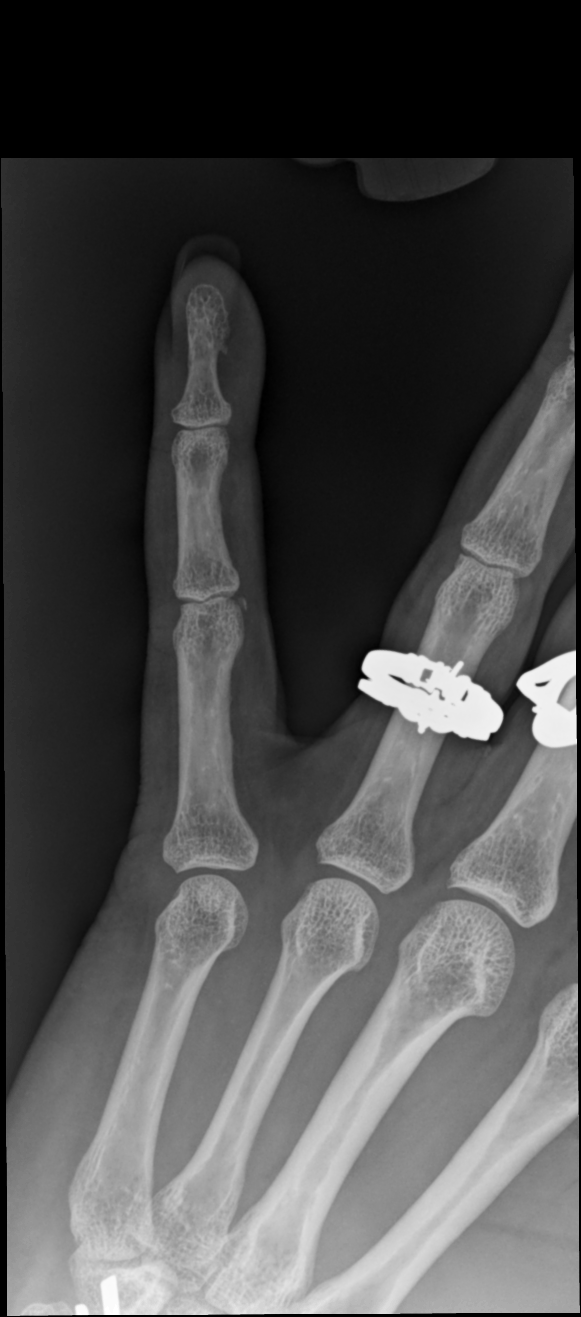

[finger mlo]
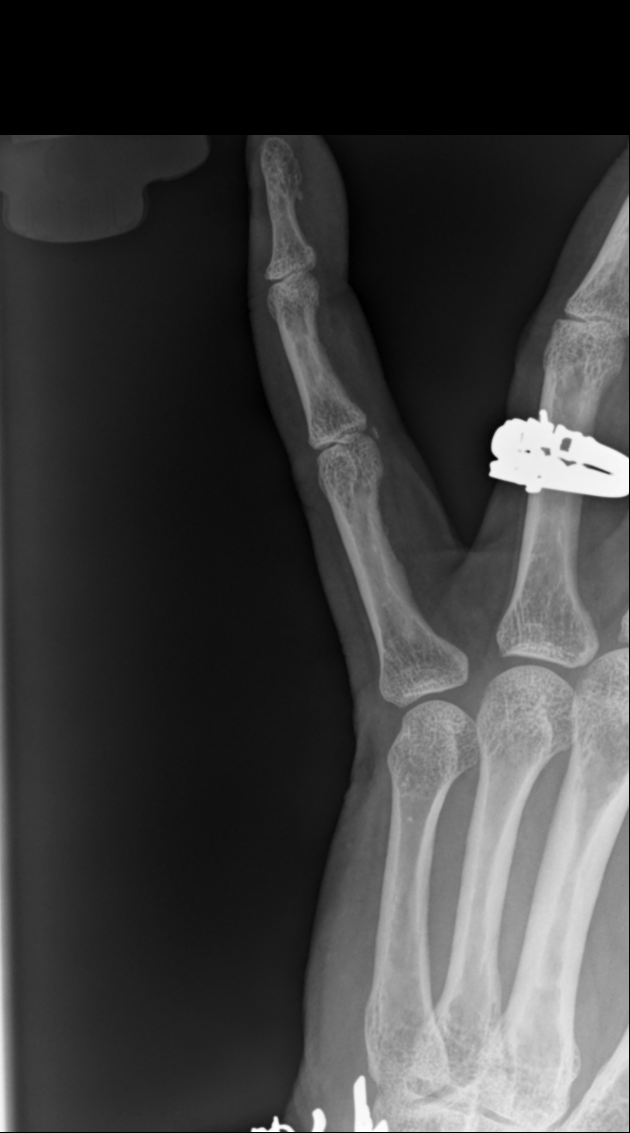

[finger lat]
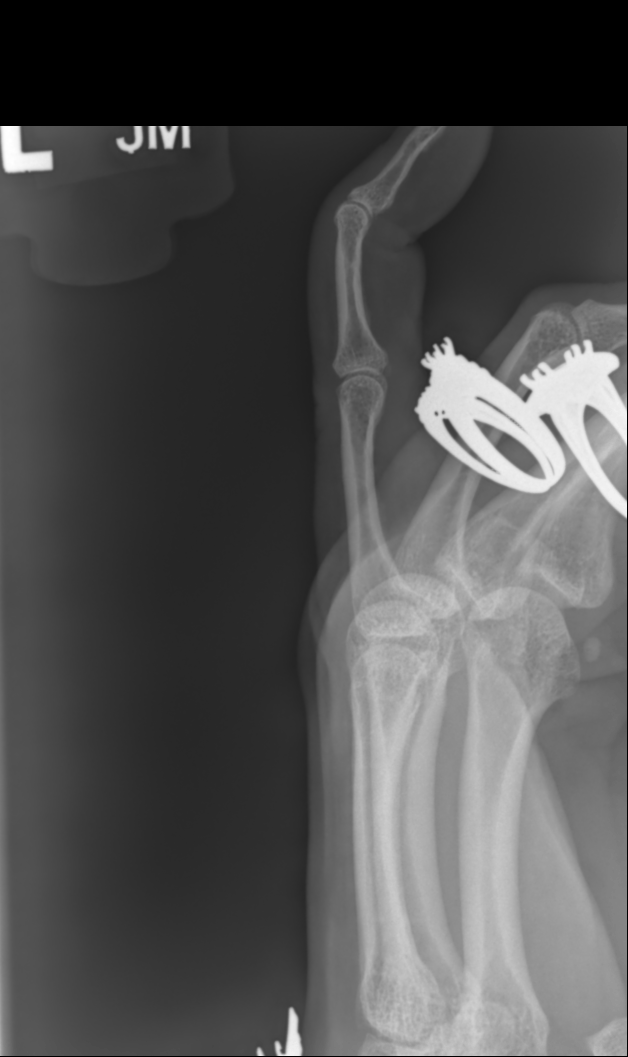

[3 of 3 positions shown; findings below may reference images not displayed]

FINDINGS: There is no evidence of acute fracture. There is a well corticated
small bony fragment along the radial aspect of the small finger
proximal phalangeal joint, favored to be an accessory sesamoid.
There is a flexion deformity at the distal interphalangeal joint
joint.
IMPRESSION: Flexion deformity at the fifth digit distal interphalangeal joint,
suspicious for mallet finger injury. No evidence of fracture.
